# Patient Record
Sex: Female | Born: 1985 | Race: White | Hispanic: No | Marital: Single | Smoking: Former smoker
Health system: Southern US, Community
[De-identification: ages and names within clinical notes are randomized; demographics above are authoritative.]

## PROBLEM LIST (undated history)

## (undated) DIAGNOSIS — G479 Sleep disorder, unspecified: Secondary | ICD-10-CM

## (undated) DIAGNOSIS — F99 Mental disorder, not otherwise specified: Secondary | ICD-10-CM

## (undated) DIAGNOSIS — R569 Unspecified convulsions: Secondary | ICD-10-CM

## (undated) DIAGNOSIS — M1990 Unspecified osteoarthritis, unspecified site: Secondary | ICD-10-CM

## (undated) DIAGNOSIS — F4310 Post-traumatic stress disorder, unspecified: Secondary | ICD-10-CM

## (undated) DIAGNOSIS — F319 Bipolar disorder, unspecified: Secondary | ICD-10-CM

## (undated) DIAGNOSIS — D649 Anemia, unspecified: Secondary | ICD-10-CM

## (undated) DIAGNOSIS — M797 Fibromyalgia: Secondary | ICD-10-CM

## (undated) DIAGNOSIS — I10 Essential (primary) hypertension: Secondary | ICD-10-CM

## (undated) DIAGNOSIS — R87619 Unspecified abnormal cytological findings in specimens from cervix uteri: Secondary | ICD-10-CM

## (undated) DIAGNOSIS — IMO0002 Reserved for concepts with insufficient information to code with codable children: Secondary | ICD-10-CM

## (undated) HISTORY — PX: COLPOSCOPY: SHX161

## (undated) HISTORY — DX: Unspecified abnormal cytological findings in specimens from cervix uteri: R87.619

## (undated) HISTORY — DX: Essential (primary) hypertension: I10

## (undated) HISTORY — DX: Post-traumatic stress disorder, unspecified: F43.10

## (undated) HISTORY — DX: Reserved for concepts with insufficient information to code with codable children: IMO0002

## (undated) HISTORY — DX: Bipolar disorder, unspecified: F31.9

## (undated) HISTORY — DX: Sleep disorder, unspecified: G47.9

## (undated) HISTORY — DX: Mental disorder, not otherwise specified: F99

## (undated) HISTORY — DX: Unspecified convulsions: R56.9

---
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
## 2003-10-06 ENCOUNTER — Inpatient Hospital Stay (HOSPITAL_COMMUNITY): Admission: RE | Admit: 2003-10-06 | Discharge: 2003-10-11 | Payer: Self-pay | Admitting: Obstetrics & Gynecology

## 2005-01-08 HISTORY — PX: DILATION AND CURETTAGE OF UTERUS: SHX78

## 2005-03-06 ENCOUNTER — Ambulatory Visit (HOSPITAL_COMMUNITY): Admission: AD | Admit: 2005-03-06 | Discharge: 2005-03-07 | Payer: Self-pay | Admitting: Obstetrics & Gynecology

## 2005-03-27 ENCOUNTER — Observation Stay (HOSPITAL_COMMUNITY): Admission: AD | Admit: 2005-03-27 | Discharge: 2005-03-28 | Payer: Self-pay | Admitting: Obstetrics and Gynecology

## 2005-08-01 ENCOUNTER — Emergency Department (HOSPITAL_COMMUNITY): Admission: EM | Admit: 2005-08-01 | Discharge: 2005-08-01 | Payer: Self-pay | Admitting: Emergency Medicine

## 2006-03-24 ENCOUNTER — Inpatient Hospital Stay (HOSPITAL_COMMUNITY): Admission: EM | Admit: 2006-03-24 | Discharge: 2006-03-27 | Payer: Self-pay | Admitting: Emergency Medicine

## 2006-03-28 ENCOUNTER — Emergency Department (HOSPITAL_COMMUNITY): Admission: EM | Admit: 2006-03-28 | Discharge: 2006-03-28 | Payer: Self-pay | Admitting: Emergency Medicine

## 2006-08-11 ENCOUNTER — Emergency Department (HOSPITAL_COMMUNITY): Admission: EM | Admit: 2006-08-11 | Discharge: 2006-08-11 | Payer: Self-pay | Admitting: Emergency Medicine

## 2006-08-24 ENCOUNTER — Ambulatory Visit: Payer: Self-pay | Admitting: Obstetrics and Gynecology

## 2006-08-24 ENCOUNTER — Inpatient Hospital Stay (HOSPITAL_COMMUNITY): Admission: AD | Admit: 2006-08-24 | Discharge: 2006-08-24 | Payer: Self-pay | Admitting: Obstetrics & Gynecology

## 2006-11-16 ENCOUNTER — Inpatient Hospital Stay (HOSPITAL_COMMUNITY): Admission: AD | Admit: 2006-11-16 | Discharge: 2006-11-16 | Payer: Self-pay | Admitting: Obstetrics & Gynecology

## 2006-11-16 ENCOUNTER — Ambulatory Visit: Payer: Self-pay | Admitting: *Deleted

## 2006-11-17 ENCOUNTER — Inpatient Hospital Stay (HOSPITAL_COMMUNITY): Admission: AD | Admit: 2006-11-17 | Discharge: 2006-11-20 | Payer: Self-pay | Admitting: Obstetrics and Gynecology

## 2006-11-17 ENCOUNTER — Encounter: Payer: Self-pay | Admitting: Obstetrics and Gynecology

## 2006-11-17 ENCOUNTER — Ambulatory Visit: Payer: Self-pay | Admitting: Obstetrics & Gynecology

## 2010-01-07 ENCOUNTER — Emergency Department (HOSPITAL_COMMUNITY)
Admission: EM | Admit: 2010-01-07 | Discharge: 2010-01-07 | Payer: Self-pay | Source: Home / Self Care | Admitting: Emergency Medicine

## 2010-05-23 NOTE — Discharge Summary (Signed)
[name], [name]            ACCOUNT NO.:  [id number]   MEDICAL RECORD NO.:  [id number]          PATIENT TYPE:  INP   LOCATION:  9372                          FACILITY:  [facility]   PHYSICIAN:  Allie Bossier, MD        DATE OF BIRTH:  05-17-1985   DATE OF ADMISSION:  11/17/2006  DATE OF DISCHARGE:  11/20/2006                               DISCHARGE SUMMARY   ADMISSION DIAGNOSES:  1. Intrauterine pregnancy at 39-weeks and four days.  2. Spontaneous onset of labor.  3. GBS negative.  4. History of intrauterine fetal demise secondary to abruption in her      previous pregnancy.   DISCHARGE DIAGNOSES:  1. Postoperative day #3 back from a vacuum-assisted vaginal delivery      of a viable infant female weighing 7 pounds 5 ounces with Apgars of 8      at one minute and 9 at five minutes.  2. Gestational hypertension, status post magnesium.  3. Obsessive compulsive disorder.  4. GBS status negative.  5. Status post midline episiotomy repair.   PROCEDURES:  1. Midline episiotomy was performed at the time of delivery on      November 17, 2006.  2. Vacuum-assisted vaginal delivery was performed November 17, 2006,      secondary to repetitive variable decelerations.   COMPLICATIONS:  The patient developed elevated blood pressures to  systolics 160, diastolics 100 in the immediate postpartum phase  requiring admission to the adult intensive care unit and magnesium  prophylaxis for possible pre-eclampsia.   CONSULTATIONS:  None.   PERTINENT LABORATORY FINDINGS:  She had CBC performed on admission  showing a white blood cell count of 13.6, hemoglobin 13.3, hematocrit  37.9, platelets 209. Her RPR was nonreactive. She had a CBC and PIH labs  performed on postoperative day #1 with white blood cell count of 14.4,  hemoglobin 12.0, platelets 172. Liver function tests were in the normal  range with AST 37, ALT 12, LDH was 185. Uric acid was 7.0. On  postoperative day #2, she had a CBC showing  white blood cell count of  9.9, hemoglobin 10.4, platelets 158.   BRIEF PERTINENT ADMISSION HISTORY:  This is a 25 year old gravida 3,  para 0 1-1-0 presenting with complaints of contractions. She was  evaluated in the MAU and found to be 5 to 6 cm dilated, 90% effaced  minus 1 station. Her contractions were every two minutes and she was  noted to be having mild variable decelerations. The patient was admitted  with spontaneous onset of labor.   HOSPITAL COURSE:  The patient was admitted and managed expectantly.  The  patient progressed to complete and due to repetitive variables, a Kiwi  vacuum was placed and a vacuum-assisted vaginal delivery was performed  on November 17, 2006, of a viable infant female weighing 7 pounds 5 ounces  with Apgars of 8 and then 8 at one minute and 9 at five minutes. There  was a midline episiotomy performed prior to the vacuum.  It was noted  that the patient had elevated blood pressures to 150 systolic  over 90  diastolic immediately postpartum and she did complain of a headache. The  patient was transferred to the adult intensive care unit where magnesium  prophylaxis for pre-eclampsia was started.  PIH labs were performed and  they were normal as stated above.  The patient continued on magnesium  for 24 hours. She continued to have elevated blood pressures requiring  initiation of antihypertensive therapy with Hydrochlorothiazide 25 mg  daily. She continued to have mild headache, but was otherwise  asymptomatic. She had good diuresis after magnesium was discontinued 24  hours postdelivery.  The patient did admit to having obsessions such as  counting. She was started on Paroxetine 20 mg p.o. daily.  She did need  a couple of doses of Labetalol for blood pressures above 160 systolic.  Her diastolics ranged in the 70 to 90.  On postoperative day #3, the  patient was ambulating, tolerating p.o. with decreasing lochia. She was  breast feeding her female infant  and refrained from deciding on a birth  control method. She states she would like to talk with the physicians at  [facility] in [place], [place] about her birth control  options.  She was in stable condition and blood pressure at the time of  discharge was 140 systolic over 80 diastolic.  The patient was to be  discharged home in stable condition.   DISCHARGE STATUS:  Stable.   DISCHARGE MEDICATIONS:  1. Prenatal Vitamins one tablet daily.  2. Colace 100 mg one tablet twice daily.  3. Motrin 600 mg one tablet by mouth every six hours as needed for      pain.  4. Hydrochlorothiazide 25 mg one tablet daily.  5. Paxil 20 mg one tablet daily.   DISCHARGE INSTRUCTIONS:  1. Discharged home.  2. No sexual activity for six weeks, otherwise activity as tolerated.  3. Regular diet.  4. The patient is to follow up at [facility] in one week for blood      pressure check. The patient is to call to schedule that      appointment, otherwise she is follow up at her six week postpartum      check.      Karlton Lemon, MD  Electronically Signed     ______________________________  Allie Bossier, MD    NS/MEDQ  D:  11/20/2006  T:  11/21/2006  Job:  [id number]   cc:   Tilda Burrow, M.D.  Fax: [phone]

## 2010-05-26 NOTE — Discharge Summary (Signed)
[name], [name]            ACCOUNT NO.:  [id number]   MEDICAL RECORD NO.:  [id number]          PATIENT TYPE:  INP   LOCATION:  A417                          FACILITY:  [facility]   PHYSICIAN:  Tilda Burrow, M.D. DATE OF BIRTH:  [birth year]   DATE OF ADMISSION:  03/24/2006  DATE OF DISCHARGE:  03/19/2008LH                               DISCHARGE SUMMARY   ADMITTING DIAGNOSIS:  1. Right pyelonephritis failing to respond oral Sulfonamide.  2. Pregnancy [redacted] weeks gestation.   DISCHARGE DIAGNOSIS:  1. Right pyelonephritis secondary to Escherichia coli resistant to      sulfa, resistant to gentamicin, sensitive to Rocephin and sensitive      to Macrodantin.  2. Pregnancy [redacted] weeks gestation, gestational sac only seen.   HISTORY OF PRESENT ILLNESS:  This 25 year old female LMP 13 February 2006, positive pregnancy test in the ER 10 days ago was admitted for  right-sided back pain, persistent despite being treated at home with  Septra for a suspected UTI and possible early pyelonephritis.   PAST MEDICAL HISTORY:  Positive for hypertension.   SOCIAL HISTORY:  Negative.   OB HISTORY:  Notable for light brown discharge for the past 3 days with  no prior pregnancy complications.   Urinalysis grossly positive for infection.   HOSPITAL COURSE:  The patient was admitted.  White count 6400,  hemoglobin 12, hematocrit 37, normal differential.  Potassium 3.7, BUN  9, creatinine 0.59 with platelets 178,000.  Quantitative HCG was 10,712.  She was admitted, placed on gentamicin.  Subsequent culture returned  showing greater than 10 to the fifth E. Coli resistant to ampicillin,  resistant to gentamicin, resistant to Septra.  It was sensitive to  ceftriaxone, levofloxacin and nitrofurantoin.  She was switched to  Rocephin and had dramatically improved response.  She will be discharged  home on Macrodantin 100 mg b.i.d. x 7 days followed by suppression for  the duration of the pregnancy at 100 mg  nightly.   Additionally pregnancy was assessed with an ultrasound showing  gestational sac of 5 weeks 5 days, 10.4 mm.  This was consistent with an  estimated gestational age and two early to determine whether this was on  a blighted ovum, an empty sac or an early pregnancy that is normal in  its results.   1. Serum progesterone was ordered and is pending at time of this      dictation.  2. Follow up quantitative HCG as outpatient.  3. Follow-up office visit April 01, 2006 for transvaginal ultrasound      to confirm status of pregnancy.  If progesterone level is low we      will initiate progesterone suppositories.   ADDENDUM:  Prenatal I profile ordered as a part of her admission  revealing rubella immune present and rest of labs are still pending.      Tilda Burrow, M.D.  Electronically Signed     JVF/MEDQ  D:  03/27/2006  T:  03/27/2006  Job:  [id number]   cc:   [facility]

## 2010-05-26 NOTE — H&P (Signed)
[name], [name]            ACCOUNT NO.:  [id number]   MEDICAL RECORD NO.:  [id number]          PATIENT TYPE:  OIB   LOCATION:  A428                          FACILITY:  [facility]   PHYSICIAN:  Lazaro Arms, M.D.   DATE OF BIRTH:  [birth year]   DATE OF ADMISSION:  03/06/2005  DATE OF DISCHARGE:  LH                                HISTORY & PHYSICAL   CHIEF COMPLAINT:  Nausea, vomiting, and dehydration at eight weeks'  gestation.   HISTORY OF PRESENT ILLNESS:  [name] is a 25 year old, gravida 2, para 1,  AB0, living 0 with an EDC of October 17, 2005, based on last menstrual  period and correlating first trimester ultrasound.  She began prenatal care  two weeks ago and was noted to have a twin gestation, however, one sack was  much smaller than the other with no visible fetal heart rate noted.  [name] thinks this may turn out to be a vanishing twin.  She was placed on  progesterone suppositories for progesterone of 10.6.  It did come up to 13,  however, the patient said she did DC the progesterone suppositories on her  own a week ago due to vaginal itching.  Risk of this was discussed and the  patient acknowledges potential risks.   PHYSICAL EXAMINATION:  HEENT:  Within normal limits.  HEART:  Regular rate and rhythm.  LUNGS:  Clear.  ABDOMEN:  Soft and nontender.  Bowel sounds are present.  The patient  complains of inability to keep down even liquids for the past few days.  Indeed, she has had about a 10 pound weight loss in the past week and a  half.  She has Phenergan p.o. at home to which she is not responding  anymore.  She has 2+ ketonuria and just feels awful.   IMPRESSION:  1.  Intrauterine pregnancy at eight weeks one day twin gestation, possible      vanishing twin.  2.  Nausea, vomiting, and dehydration.   PLAN:  1.  We will admit to observation in labor and delivery for IV fluid      rehydration, antiemetics.  2.  We will do some lab work.  3.  Get an  ultrasound over there to assess the status of the gestation.      [name], C.N.M.      Lazaro Arms, M.D.  Electronically Signed    FC/MEDQ  D:  03/06/2005  T:  03/06/2005  Job:  [id number]   cc:   [facility]

## 2010-05-26 NOTE — Group Therapy Note (Signed)
NAMEHANNAN, [name] NO.:  [id number]   MEDICAL RECORD NO.:  [id number]          PATIENT TYPE:  OBV   LOCATION:  LDR3                          FACILITY:  [facility]   PHYSICIAN:  Tilda Burrow, M.D. DATE OF BIRTH:  30-Jun-1985   DATE OF PROCEDURE:  03/28/2005  DATE OF DISCHARGE:                                   PROGRESS NOTE   ADMISSION DIAGNOSES:  1.  Diamniotic twin gestation 11 weeks.  2.  Nausea, vomiting and mild dehydration.   HISTORY OF PRESENT ILLNESS:  This 25 year old female gravida 2, para 1, last  menstrual period January 10, 2005, placing menstrual [facility] October 17, 2005,  with a 6 week ultrasound on February 21, 2005, corresponding within one day.  She is now [redacted] weeks gestation by consensus criteria.  This has been followed  through our office for diamniotic twin gestation with two separate sacks  which were large in size.  There is a question of a possible fluid  collection near the head of baby A noted at the 8 week ultrasound.  She is  referred to do perinatal March 29, 2005, for follow up ultrasound.  Initial  ultrasound was done at [facility] and was available through the  hospital record system.  The patient is called in after 24 hours of  inability to keep anything in her stomach at all.  Review of pregnancy  records shows that she has had progressive weight loss since diagnosis of  pregnancy, having weighed 175 on February 21, 2005, and 166 at last visit on  March 14, 2005.  Upon arrival here, her weight is recorded as 161, 14 pounds  down from initiation of pregnancy.  Laboratory data is obtained but is  considered incorrect due to phlebotomy being performed on the arm with the  vigorous IV infusion actively going on.  This was performed after attempts  on the opposite side have been failed.  She has prior prenatal workup with  documented hemoglobin of 14, hematocrit 40, and labs here suggest severe  anemia consistent with patient's  presentation.  Urinalysis shows specific  gravity greater than 1.030 with ketones greater than 80 mg/dL.  Leukocyte  esterase, nitrites and hemoglobin negative.   PHYSICAL EXAMINATION:  GENERAL APPEARANCE:  A mild and dehydrated (less than  5%) Caucasian female who is resting comfortably after having received  moderate fluid bolus.  ABDOMEN:  Nontender, bowel sounds are present.  She is tolerating dinner at  this time with some solid food consumption without vomiting, after having  received IV Phenergan.   IMPRESSION:  Mild dehydration, hyperemesis, gravid and twin gestation.   PLAN:  Supportive care, Reglan, IV Phenergan, probable brief hospitalization  in observation status.  perinatal      Tilda Burrow, M.D.  Electronically Signed     JVF/MEDQ  D:  03/28/2005  T:  03/28/2005  Job:  [id number]   cc:   [facility]   [facility]  [place], [place]

## 2010-05-26 NOTE — Discharge Summary (Signed)
NAMEOLISA, QUESNEL            ACCOUNT NO.:  [id number]   MEDICAL RECORD NO.:  [id number]          PATIENT TYPE:  INP   LOCATION:  A426                          FACILITY:  [facility]   PHYSICIAN:  Tilda Burrow, M.D. DATE OF BIRTH:  06-04-1985   DATE OF ADMISSION:  10/06/2003  DATE OF DISCHARGE:  10/03/2005LH                                 DISCHARGE SUMMARY   ADMITTING DIAGNOSES:  1.  Abdominal pain since 3:30 a.m.  2.  Fetal demise in uterus secondary to rupture of placenta.   HISTORY OF PRESENT ILLNESS:  This 25 year old female, gravida 1, para 0, at  67 weeks' gestation presents via EMS after awakening with marked abdominal  discomfort with transportation via EMS to [facility] arriving at  5:15 a.m. where external monitoring showed no evidence of fetal heart rate.  I was called and arrived within a few minutes after initial phone contact at  5:45 a.m. with arrival and evaluation where the patient was found to have  ultrasonic confirmation of fetal demise in utero.  Ultrasound showed an  anterior placenta with suspected retroplacental clot and a small amount of  dark vaginal bleed.  There has been a history of trauma.  She was seen with  prenatal care with mildly elevated blood pressure over the last four visits,  but normal lab evaluation October 01, 2003.   PAST MEDICAL HISTORY:  Positive for borderline hypertension.   PAST SURGICAL HISTORY:  Appendectomy.   ALLERGIES:  MORPHINE, which causes hives.   MEDICATIONS:  None.   She had been single, living with the baby's father, working at [employer].  Had been taken out of work secondary to hypertension on October 01, 2003,  at which time blood pressure was 150/100 with urine protein negative.  No  bleeding and nontender abdomen.   HOSPITAL COURSE:  The patient was admitted and had ultrasound by me  revealing the previously mentioned suspected retroplacental clot.  She was  counseled over treatment options and  after some time for initial grieving  and questions, the patient was placed on Cytotec for cervical ripening with  25 mcg vaginal suppository placed at 10:00 a.m.  She had increasing doses of  Cytotec 50 mcg fused at 12:30 p.m. and then at 6:00 p.m. with cervix softer,  minimal contractions, progress without cervical response.  Cervix was soft  enough at 6:00 p.m. that a 50 cc balloon could be placed in the cervix.  Her  laboratory data included a hemoglobin of 11.3, hematocrit 32.2 with  platelets 185k with fibrinogen 324,000 at that time.  She was considered  safe enough to continue induction.  We continued Cytotec through the night  with patient remaining amazingly comfortable through the night.  At 6:00  a.m. laboratory evaluation showed platelets 141,000, fibrinogen 410 mg,  hemoglobin 10, hematocrit 29.1%, white count 10,200.  Cervix at this time  was 2 to 3 cm, 20% and -2.  Amniotomy showed clear fluid, generous volume,  vertex presentation.  She progressed in three hours to 3 to 4 cm, 75%, -2  with IV analgesics tolerating things well.  At  9:30 a.m. molding was  present.  She had paracervical block applied which dramatically improved her  discomfort at midday and she progressed to delivery with 2 pound, 12.2 ounce  stillborn female infant followed by an intact placenta that had a 503 gram  clot attached to almost the entire surface of the placenta.  Tallia viewed  the baby, but did not hold the baby.  The patient's mother held and viewed  the baby.  The patient was supported by pastoral care visits as well as  family members.   Postpartum day one was notable for normal blood pressures, reflexes 2+ and  3+ with 3+ edema with hemoglobin 8.5, hematocrit 24% with platelets 125,000  .  The patient had been on magnesium sulfate during the labor process and  this was discontinued 24 hours postpartum.  The evening of postpartum day  one she had a temperature to 102.8 reducing to 101.1 on  October 09, 2003 with  blood pressure 139/88.  The abdomen was nontender.  She was placed on  ampicillin and gentamicin thinking that group B Strep could be the most  likely source or probably microbial etiology.  Cleocin was added as well.  She progressed, defervesced.  Showed stable hemoglobin of 8.5, hematocrit  24.6 with ampicillin, gentamicin and Cleocin continued times 48 hours with  discharge at that time on October 11, 2003.   Pathology report showed a placenta which showed a centrally located cord  insertion, as normal.  The placenta weighed 180 grams.  There was 365 grams  of clot sent as a specimen.  This is down from the 530 grams it weighed  prior to being placed in formalin.  This was from the backside of the  placenta.   The patient was stable for discharge with apparently satisfactory progress  made in her grieving response.  She will be followed up briefly in one week  with pastoral care follow-up contact to be arranged.   MEDICATIONS:  1.  Motrin 800 mg t.i.d.  2.  HCTZ 25 mg p.o. q.d.  3.  __________ 4 take twice daily until return for postpartum visits      completed.  4.  Levaquin 500 mg q.d. times five days.     John   JVF/MEDQ  D:  11/14/2003  T:  11/15/2003  Job:  [id number]   cc:   Tilda Burrow, M.D.  [address] [place]  [place] [address]  Fax: [phone]

## 2010-05-26 NOTE — Discharge Summary (Signed)
NAMEDEBORRA, [name]            ACCOUNT NO.:  [id number]   MEDICAL RECORD NO.:  [id number]          PATIENT TYPE:  OIB   LOCATION:  A428                          FACILITY:  [facility]   PHYSICIAN:  Tilda Burrow, M.D. DATE OF BIRTH:  June 13, 1985   DATE OF ADMISSION:  03/06/2005  DATE OF DISCHARGE:  02/28/2007LH                                 DISCHARGE SUMMARY   REASON FOR ADMISSION:  Pregnancy at 8 weeks, twin gestation. Nausea,  vomiting, dehydration.   Upon admission, [name] had 3+ ketones in her urine. Labs were drawn and  reviewed to be normal with the exception of ketonuria. The patient responded  well to IV hydration and IV Phenergan. She is now retaining p.o. well and  desires discharge this morning. Ultrasound reveals vital twin gestation.  Also progesterone level is now down to 9.6. This has been discussed with  patient on prior visits in regards that she stopped her progesterone  suppositories due to vaginal discomfort. I reinforced again this morning the  importance of keeping her progesterone level up and using her suppositories  b.i.d. I also explained to her the risks involved of possible miscarriage if  her progesterone level drops any lower than then 9.6 it is now. The patient  verbalized understanding but did not respond to me that she would restart  her suppositories.   PLAN:  We are going to discharge her home today with p.o. Phenergan and p.o.  Zofran. She is going to follow up in the office Friday to see how well she  is doing at home on p.o. antiemetics.      Zerita Boers, Lanier Clam      Tilda Burrow, M.D.  Electronically Signed    DL/MEDQ  D:  09/81/1914  T:  03/07/2005  Job:  [id number]   cc:   [facility]

## 2010-05-26 NOTE — Op Note (Signed)
NAME:  [name], [name] NO.:  [id number]   MEDICAL RECORD NO.:  [id number]          PATIENT TYPE:  INP   LOCATION:  LDR1                          FACILITY:  [facility]   PHYSICIAN:  Tilda Burrow, M.D. DATE OF BIRTH:  [birth year]   DATE OF PROCEDURE:  DATE OF DISCHARGE:                                 OPERATIVE REPORT   Continuing delivery note on [name], 1:45 p.m.  Dictation  interrupted.  Continuing:   The patient delivered the placenta, normal in appearance, with a thick cord  with plenty of Wharton's jelly.  There was immediately after the placenta  was delivered a large, dark, old clot which weighed 503 g.  There was no  further bleeding and no clots were able to be expressed on uterine massage.  The patient then did well with no further bleeding.  A prayer was conducted  with the family.  We then asked the minister to visit with the family.   The patient will be kept on magnesium sulfate for 24 hours.     John   JVF/MEDQ  D:  10/07/2003  T:  10/08/2003  Job:  [id number]

## 2010-05-26 NOTE — Consult Note (Signed)
NAMEMARLETTA, [name]            ACCOUNT NO.:  [id number]   MEDICAL RECORD NO.:  [id number]          PATIENT TYPE:  INP   LOCATION:  A417                          FACILITY:  [facility]   PHYSICIAN:  Tilda Burrow, M.D. DATE OF BIRTH:  [birth year]   DATE OF CONSULTATION:  DATE OF DISCHARGE:                                 CONSULTATION   ADMISSION DIAGNOSES:  1. Right pyelonephritis, failing to respond to oral sulfonamides.  2. Pregnancy at [redacted] weeks gestation.   HISTORY OF PRESENT ILLNESS:  This 25 year old female with LMP February 13, 2006, with positive pregnancy test 10 days ago at [facility] emergency room  where she was seen for right-sided back pain and diagnosed as having a  UTI and early pregnancy.  She is seen again in the emergency room here  after two phone calls earlier this weekend for persistent right-sided  back pain.  She has been afebrile at home but has not had relief of the  pain, has had persistent urinary frequency and discomfort.  She has been  on Septra and has been requiring Darvocet for pain.  She is admitted  after urinalysis in the emergency room showing positive evidence of a  persistent urinary tract infection, and physical exam shows tenderness  to percussion over the right kidney area. She is admitted for IV  antibiotic therapy.   PAST MEDICAL HISTORY:  Positive for hypertension.   SOCIAL HISTORY:  Negative.   OB HISTORY:  LMP February 13, 2006.  Light brown discharge for past 3  days.  She is having no frank bleeding.  She has had no prior pregnancy  complications.   PHYSICAL EXAMINATION:  VITAL SIGNS:  She presented at 8;30 a.m.  afebrile, temperature 98.3, pulse 92, respirations 16.  Pain rated 8/10.  O2 saturation 98% on room air.  GENERAL:  Alert, mildly uncomfortable, oriented x3, and cooperative with  exam.  CHEST:  Clear.  ABDOMEN:  Nontender to palpation.  PELVIC:  Exam deferred at this time.  BACK:  CVA tenderness, very specifically noted on the  right side.   Urinalysis is positive for nitrites, leukocyte esterase, 30 mg/dl  protein, and trace hemoglobin.  Microscopic shows 21-50 white cells, 11-  20 red cells, and many bacteria.   IMPRESSION:  Urinary tract infection, early pyelonephritis not  responding to oral sulfonamides.   PLAN:  1. Admit.  2. Due to alleged KEFLEX allergy (rash and redness), aminoglycoside      and proceed with initial prenatal labs.  3. Anticipate brief hospitalization.      Tilda Burrow, M.D.  Electronically Signed     JVF/MEDQ  D:  03/24/2006  T:  03/24/2006  Job:  [id number]

## 2010-05-26 NOTE — Op Note (Signed)
NAMEDAINELLE, HUN            ACCOUNT NO.:  [id number]   MEDICAL RECORD NO.:  [id number]          PATIENT TYPE:  INP   LOCATION:  LDR1                          FACILITY:  [facility]   PHYSICIAN:  Tilda Burrow, M.D. DATE OF BIRTH:  03/31/1985   DATE OF PROCEDURE:  10/07/2003  DATE OF DISCHARGE:                                 OPERATIVE REPORT   LABOR SUMMARY AND DELIVERY NOTE:  Waylon's labor was a prolonged process.  On October 06, 2003, she had Pitocin which was infused up to 16 to 18  milliunits per minute without response to oxytocin. Uterine tone remained  high, but the cervix was anterior, firm and closed. Efforts to insert a  Foley bulb in the cervix were unsuccessful x3. When then converted to  Cytotec for cervical ripening. Initially 25 mcg tablets, then 50 mcg tablets  which were administered through the afternoon of October 06, 2003. A Foley  bulb balloon could be reinserted at 6:35 p.m. and was left in overnight.  Cervix dilated in response to this. Meanwhile, hemoglobin changes were  gradual with hemoglobin 11.6, hematocrit 33 at noon; 11.3 and 32.2 at 6 p.m.  On the morning of September 29, the hemoglobin was diminished to 10.1,  hematocrit 29.1. Platelets varied from 189 upon admission to 159 six hours  later to 185 at 6 p.m. last night and decreased to 141,000 at 6 a.m.   The patient received a paracervical block as she reached active phase labor  and over the last hour had good relief. She delivered at 1:45 p.m. over an  intact peritoneum, a 2-pound 12.2 ounce female infant stillborn followed by a   Dictation ended at this point.     John   JVF/MEDQ  D:  10/07/2003  T:  10/07/2003  Job:  [id number]

## 2010-10-17 LAB — COMPREHENSIVE METABOLIC PANEL
AST: 37
Albumin: 2.8 — ABNORMAL LOW
Alkaline Phosphatase: 223 — ABNORMAL HIGH
BUN: 5 — ABNORMAL LOW
Chloride: 108
GFR calc Af Amer: >60
Potassium: 3.6
Sodium: 137
Total Bilirubin: 0.7
Total Protein: 6.4

## 2010-10-17 LAB — CBC
HCT: 29.8 — ABNORMAL LOW
Hemoglobin: 10.4 — ABNORMAL LOW
MCHC: 35.2
MCV: 83.2
MCV: 84.8
Platelets: 158
Platelets: 172
Platelets: 209
RBC: 4.55
RDW: 15.9 — ABNORMAL HIGH
RDW: 16 — ABNORMAL HIGH
RDW: 16.5 — ABNORMAL HIGH
WBC: 14.4 — ABNORMAL HIGH

## 2010-10-17 LAB — URIC ACID: Uric Acid, Serum: 7

## 2010-10-17 LAB — RPR: RPR Ser Ql: NONREACTIVE

## 2010-10-20 LAB — URINALYSIS, ROUTINE W REFLEX MICROSCOPIC
Ketones, ur: NEGATIVE
Leukocytes, UA: NEGATIVE
Nitrite: NEGATIVE
Protein, ur: NEGATIVE
pH: 6

## 2010-10-20 LAB — RAPID URINE DRUG SCREEN, HOSP PERFORMED
Amphetamines: NOT DETECTED
Barbiturates: NOT DETECTED
Benzodiazepines: NOT DETECTED
Opiates: NOT DETECTED
Tetrahydrocannabinol: NOT DETECTED

## 2010-10-20 LAB — WET PREP, GENITAL
Trich, Wet Prep: NONE SEEN
Yeast Wet Prep HPF POC: NONE SEEN

## 2010-10-20 LAB — URINE MICROSCOPIC-ADD ON

## 2010-12-14 ENCOUNTER — Other Ambulatory Visit: Payer: Self-pay | Admitting: Family Medicine

## 2010-12-14 ENCOUNTER — Other Ambulatory Visit (HOSPITAL_COMMUNITY)
Admission: RE | Admit: 2010-12-14 | Discharge: 2010-12-14 | Disposition: A | Discharge status: Home / Self Care | Payer: Medicaid Other | Source: Ambulatory Visit | Attending: Obstetrics and Gynecology | Admitting: Obstetrics and Gynecology

## 2010-12-14 DIAGNOSIS — Z01419 Encounter for gynecological examination (general) (routine) without abnormal findings: Secondary | ICD-10-CM | POA: Insufficient documentation

## 2010-12-14 DIAGNOSIS — Z113 Encounter for screening for infections with a predominantly sexual mode of transmission: Secondary | ICD-10-CM | POA: Insufficient documentation

## 2010-12-14 LAB — OB RESULTS CONSOLE HEPATITIS B SURFACE ANTIGEN: Hepatitis B Surface Ag: NEGATIVE

## 2010-12-14 LAB — OB RESULTS CONSOLE HIV ANTIBODY (ROUTINE TESTING): HIV: NONREACTIVE

## 2010-12-14 LAB — OB RESULTS CONSOLE ABO/RH: RH Type: POSITIVE

## 2010-12-14 LAB — OB RESULTS CONSOLE ANTIBODY SCREEN: Antibody Screen: NEGATIVE

## 2010-12-14 LAB — OB RESULTS CONSOLE RUBELLA ANTIBODY, IGM: Rubella: IMMUNE

## 2011-07-23 ENCOUNTER — Other Ambulatory Visit: Payer: Self-pay | Admitting: Obstetrics and Gynecology

## 2011-07-23 ENCOUNTER — Telehealth (HOSPITAL_COMMUNITY): Payer: Self-pay | Admitting: *Deleted

## 2011-07-23 ENCOUNTER — Encounter (HOSPITAL_COMMUNITY): Payer: Self-pay | Admitting: *Deleted

## 2011-07-23 NOTE — H&P (Signed)
[name] is a 26 y.o. 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 Nonstress tests have been Maternal Medical History:  Prenatal complications: Hypertension.     OB History    Grav Para Term Preterm Abortions TAB SAB Ect Mult Living   4 2 1 1 1  1   1      Past Medical History  Diagnosis Date  . Seizures     Epilepsy  . Hypertension     no meds  . Mental disorder     Bi Polar  . Abnormal Pap smear    Past Surgical History  Procedure Date  . Dilation and curettage of uterus 2007  . Appendectomy 1995  . Colposcopy    Family History: family history includes Autism in her cousin; Cancer in her maternal grandmother; and Stroke in her maternal grandfather, maternal grandmother, paternal grandfather, and paternal grandmother. Social History:  does not have a smoking history on file. She does not have any smokeless tobacco history on file. Her alcohol and drug histories not on file.   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   Genitourinary: Negative.        Short cervix during March and April without progression cervical length 2.2-2.9 cm in length. Betamethasone administered and a  16th and 17th 2013      Last menstrual period 10/23/2010. Maternal Exam:  Abdomen: Patient reports no abdominal tenderness. Fundal height is 35 cm.   Estimated fetal weight is 7 pounds.   Fetal presentation: vertex  Introitus: Normal vulva. Normal vagina.  Ferning test: not done.   Pelvis: adequate for delivery.   Cervix: Cervix evaluated by digital exam.     Physical Exam  Constitutional: She is oriented to person, place, and time. She appears well-developed and well-nourished.       Weight 195 pounds blood pressure 140/90  HENT:  Head: Normocephalic and atraumatic.  Eyes: EOM are normal. Pupils are equal, round, and reactive to light.  Neck: Normal range of motion. Neck supple.  Cardiovascular: Normal rate and regular rhythm.   Respiratory: Effort normal and breath sounds normal. She has no wheezes.  GI: Soft.       Gravid uterus consistent with dates 35 cm fundal height  Neurological: She is oriented to person, place, and time.  Psychiatric: She has a normal mood and affect. Her behavior is normal.       Diffusely anxious    Prenatal labs: ABO, Rh: A/Positive/-- (12/06 0000) Antibody: Negative (12/06 0000) Rubella: Immune (12/06 0000) RPR: Nonreactive (12/06 0000)  HBsAg: Negative (12/06 0000)  HIV: Non-reactive (12/06 0000)  GBS: Positive (06/27 0000)   Assessment/Plan: Pregnancy 39-1/[redacted] weeks gestation in labor. History epilepsy, treated with Lamictal, bipolar disorder with anxiety treated with Lexapro, history of abruption at 17  weeks with prior pregnancy, scheduled for induction of labor, gestational hypertension without evidence of preeclampsia over the last 2 weeks, reactive nonstress test biweekly  Plan: Induction of labor on Friday, 07/27/2011   Indie Boehne V 07/23/2011, 3:44 PM

## 2011-07-23 NOTE — Telephone Encounter (Signed)
Preadmission screen  

## 2011-07-27 ENCOUNTER — Encounter (HOSPITAL_COMMUNITY): Payer: Self-pay

## 2011-07-27 ENCOUNTER — Inpatient Hospital Stay (HOSPITAL_COMMUNITY): Payer: Medicaid Other | Admitting: Anesthesiology

## 2011-07-27 ENCOUNTER — Inpatient Hospital Stay (HOSPITAL_COMMUNITY)
Admission: RE | Admit: 2011-07-27 | Discharge: 2011-07-29 | DRG: 775 | Disposition: A | Discharge status: Home / Self Care | Payer: Medicaid Other | Source: Ambulatory Visit | Attending: Obstetrics and Gynecology | Admitting: Obstetrics and Gynecology

## 2011-07-27 ENCOUNTER — Encounter (HOSPITAL_COMMUNITY): Payer: Self-pay | Admitting: Anesthesiology

## 2011-07-27 DIAGNOSIS — O139 Gestational [pregnancy-induced] hypertension without significant proteinuria, unspecified trimester: Principal | ICD-10-CM | POA: Diagnosis present

## 2011-07-27 DIAGNOSIS — G40909 Epilepsy, unspecified, not intractable, without status epilepticus: Secondary | ICD-10-CM | POA: Diagnosis present

## 2011-07-27 LAB — CBC
HCT: 29.5 % — ABNORMAL LOW (ref 36.0–46.0)
Hemoglobin: 10 g/dL — ABNORMAL LOW (ref 12.0–15.0)
MCH: 26.8 pg (ref 26.0–34.0)
MCHC: 32.9 g/dL (ref 30.0–36.0)
MCV: 82.7 fL (ref 78.0–100.0)
MCV: 81.5 fL (ref 78.0–100.0)
Platelets: 153 10*3/uL (ref 150–400)
Platelets: 132 10*3/uL — ABNORMAL LOW (ref 150–400)
RBC: 4.05 MIL/uL (ref 3.87–5.11)
RBC: 3.75 MIL/uL — ABNORMAL LOW (ref 3.87–5.11)
RDW: 15.8 % — ABNORMAL HIGH (ref 11.5–15.5)
WBC: 6.3 10*3/uL (ref 4.0–10.5)
WBC: 6.9 10*3/uL (ref 4.0–10.5)

## 2011-07-27 LAB — COMPREHENSIVE METABOLIC PANEL
AST: 16 U/L (ref 0–37)
Albumin: 2.7 g/dL — ABNORMAL LOW (ref 3.5–5.2)
Alkaline Phosphatase: 136 U/L — ABNORMAL HIGH (ref 39–117)
Chloride: 104 mEq/L (ref 96–112)
Creatinine, Ser: 0.49 mg/dL — ABNORMAL LOW (ref 0.50–1.10)
Potassium: 4.3 mEq/L (ref 3.5–5.1)
Total Bilirubin: 0.2 mg/dL — ABNORMAL LOW (ref 0.3–1.2)
Total Protein: 5.9 g/dL — ABNORMAL LOW (ref 6.0–8.3)

## 2011-07-27 LAB — RPR: RPR Ser Ql: NONREACTIVE

## 2011-07-27 LAB — ABO/RH: ABO/RH(D): A POS

## 2011-07-27 LAB — TYPE AND SCREEN

## 2011-07-27 LAB — PROTEIN / CREATININE RATIO, URINE: Total Protein, Urine: 5.3 mg/dL

## 2011-07-27 MED ORDER — FLEET ENEMA 7-19 GM/118ML RE ENEM: 1.0000 | ENEMA | RECTAL | Status: DC | PRN

## 2011-07-27 MED ORDER — SODIUM BICARBONATE 8.4 % IV SOLN
INTRAVENOUS | Status: DC | PRN
  Administered 2011-07-27: 4 mL via EPIDURAL

## 2011-07-27 MED ORDER — LIDOCAINE HCL (PF) 1 % IJ SOLN
INTRAMUSCULAR | Status: DC | PRN
  Administered 2011-07-27 (×3): 4 mL

## 2011-07-27 MED ORDER — PHENYLEPHRINE 40 MCG/ML (10ML) SYRINGE FOR IV PUSH (FOR BLOOD PRESSURE SUPPORT): 80.0000 ug | PREFILLED_SYRINGE | INTRAVENOUS | Status: DC | PRN

## 2011-07-27 MED ORDER — MISOPROSTOL 25 MCG QUARTER TABLET
25.0000 ug | ORAL_TABLET | ORAL | Status: DC | PRN
  Administered 2011-07-27: 25 ug via VAGINAL
  Filled 2011-07-27: qty 1
  Filled 2011-07-27 (×2): qty 0.25

## 2011-07-27 MED ORDER — PENICILLIN G POTASSIUM 5000000 UNITS IJ SOLR
2.5000 10*6.[IU] | INTRAVENOUS | Status: DC
  Administered 2011-07-27 (×3): 2.5 10*6.[IU] via INTRAVENOUS
  Filled 2011-07-27 (×5): qty 2.5

## 2011-07-27 MED ORDER — LIDOCAINE HCL (PF) 1 % IJ SOLN
30.0000 mL | INTRAMUSCULAR | Status: DC | PRN
  Filled 2011-07-27: qty 30

## 2011-07-27 MED ORDER — OXYTOCIN 40 UNITS IN LACTATED RINGERS INFUSION - SIMPLE MED
62.5000 mL/h | Freq: Once | INTRAVENOUS | Status: DC
  Filled 2011-07-27 (×2): qty 1000

## 2011-07-27 MED ORDER — NALBUPHINE SYRINGE 5 MG/0.5 ML: 5.0000 mg | INJECTION | INTRAMUSCULAR | Status: DC | PRN

## 2011-07-27 MED ORDER — PENICILLIN G POTASSIUM 5000000 UNITS IJ SOLR
5.0000 10*6.[IU] | Freq: Once | INTRAVENOUS | Status: AC
  Administered 2011-07-27: 5 10*6.[IU] via INTRAVENOUS
  Filled 2011-07-27: qty 5

## 2011-07-27 MED ORDER — DIPHENHYDRAMINE HCL 50 MG/ML IJ SOLN: 12.5000 mg | INTRAMUSCULAR | Status: DC | PRN

## 2011-07-27 MED ORDER — ACETAMINOPHEN 325 MG PO TABS: 650.0000 mg | ORAL_TABLET | ORAL | Status: DC | PRN

## 2011-07-27 MED ORDER — PHENYLEPHRINE 40 MCG/ML (10ML) SYRINGE FOR IV PUSH (FOR BLOOD PRESSURE SUPPORT)
80.0000 ug | PREFILLED_SYRINGE | INTRAVENOUS | Status: DC | PRN
  Filled 2011-07-27: qty 5

## 2011-07-27 MED ORDER — LACTATED RINGERS IV SOLN
INTRAVENOUS | Status: DC
  Administered 2011-07-27: 20:00:00 via INTRAVENOUS
  Administered 2011-07-27: 125 mL/h via INTRAVENOUS

## 2011-07-27 MED ORDER — FENTANYL 2.5 MCG/ML BUPIVACAINE 1/10 % EPIDURAL INFUSION (WH - ANES)
14.0000 mL/h | INTRAMUSCULAR | Status: DC
  Administered 2011-07-27 (×3): 14 mL/h via EPIDURAL
  Filled 2011-07-27 (×3): qty 60

## 2011-07-27 MED ORDER — OXYTOCIN 40 UNITS IN LACTATED RINGERS INFUSION - SIMPLE MED: 1.0000 m[IU]/min | INTRAVENOUS | Status: DC

## 2011-07-27 MED ORDER — SODIUM BICARBONATE 8.4 % IV SOLN
INTRAVENOUS | Status: DC | PRN
  Administered 2011-07-27: 5 mL via EPIDURAL

## 2011-07-27 MED ORDER — OXYTOCIN BOLUS FROM INFUSION
250.0000 mL | Freq: Once | INTRAVENOUS | Status: DC
  Filled 2011-07-27: qty 500

## 2011-07-27 MED ORDER — TERBUTALINE SULFATE 1 MG/ML IJ SOLN: 0.2500 mg | Freq: Once | INTRAMUSCULAR | Status: AC | PRN

## 2011-07-27 MED ORDER — LACTATED RINGERS IV SOLN
500.0000 mL | INTRAVENOUS | Status: DC | PRN
  Administered 2011-07-27: 500 mL via INTRAVENOUS

## 2011-07-27 MED ORDER — OXYCODONE-ACETAMINOPHEN 5-325 MG PO TABS: 1.0000 | ORAL_TABLET | ORAL | Status: DC | PRN

## 2011-07-27 MED ORDER — OXYTOCIN 40 UNITS IN LACTATED RINGERS INFUSION - SIMPLE MED
1.0000 m[IU]/min | INTRAVENOUS | Status: DC
  Administered 2011-07-27 (×2): 2 m[IU]/min via INTRAVENOUS

## 2011-07-27 MED ORDER — IBUPROFEN 600 MG PO TABS: 600.0000 mg | ORAL_TABLET | Freq: Four times a day (QID) | ORAL | Status: DC | PRN

## 2011-07-27 MED ORDER — EPHEDRINE 5 MG/ML INJ: 10.0000 mg | INTRAVENOUS | Status: DC | PRN

## 2011-07-27 MED ORDER — MISOPROSTOL 200 MCG PO TABS
800.0000 ug | ORAL_TABLET | Freq: Once | ORAL | Status: DC
  Filled 2011-07-27: qty 4

## 2011-07-27 MED ORDER — ONDANSETRON HCL 4 MG/2ML IJ SOLN: 4.0000 mg | Freq: Four times a day (QID) | INTRAMUSCULAR | Status: DC | PRN

## 2011-07-27 MED ORDER — LACTATED RINGERS IV SOLN
500.0000 mL | Freq: Once | INTRAVENOUS | Status: AC
  Administered 2011-07-27: 500 mL via INTRAVENOUS

## 2011-07-27 MED ORDER — EPHEDRINE 5 MG/ML INJ
10.0000 mg | INTRAVENOUS | Status: DC | PRN
  Filled 2011-07-27: qty 4

## 2011-07-27 MED ORDER — CITRIC ACID-SODIUM CITRATE 334-500 MG/5ML PO SOLN
30.0000 mL | ORAL | Status: DC | PRN
  Administered 2011-07-27: 30 mL via ORAL
  Filled 2011-07-27: qty 15

## 2011-07-27 NOTE — Progress Notes (Signed)
[name] is a 26 y.o. [phone] (prior history of IUFD at 35 weeks) at [redacted]w[redacted]d by LMP admitted for induction of labor due to gestational hypertension.  Subjective: [name] is feeling well this morning.  She reports good fetal movement and denies LOF, contractions, and vaginal bleeding.  She denies HA, blurry vision and RUQ/epigastric pain.  Objective: BP 147/101  Pulse 95  Temp 98.1 F (36.7 C) (Oral)  Resp 20  Ht 5\' 5"  (1.651 m)  Wt 88.451 kg (195 lb)  BMI 32.45 kg/m2  LMP 10/23/2010     Temp:  [98.1 F (36.7 C)] 98.1 F (36.7 C) (07/19 0748) Pulse Rate:  [95-99] 95  (07/19 0839) Resp:  [20] 20  (07/19 0839) BP: (147-150)/(101) 147/101 mmHg (07/19 0839) Weight:  [88.451 kg (195 lb)] 88.451 kg (195 lb) (07/19 0800)  FHT:  FHR: 125 bpm, variability: moderate,  accelerations:  Present,  decelerations:  Absent UC:   none SVE:   Dilation: 2 Effacement (%): 50 Station: Ballotable Exam by:: [employer]: Lab Results  Component Value Date   WBC 6.3 07/27/2011   HGB 10.6* 07/27/2011   HCT 33.6* 07/27/2011   MCV 83.0 07/27/2011   PLT 153 07/27/2011    Assessment / Plan: Induction of labor due to gestational hypertension.  Given Bishop score of 5, will start with Foley balloon and Cytotec, and then progress to Pitocin as needed.  Blood pressure not in severe range. Labor: Not started Preeclampsia:  Gestational HTN, no evidence of pre-e. Urine protein:creatinine pending. CMP and CBC as well. Fetal Wellbeing:  Category I Pain Control:  Labor support without medications Anticipated MOD:  NSVD  Dr. Adrian Blackwater present for initial evaluation as well.  Junious Silk S 07/27/2011, 9:13 AM  ADDENDUM: Foley balloon and Cytotec #1 ( ) placed at 0930.  Pt tolerated procedure well.  Will defer cervical re-check until Foley falls out, pt feeling increased pressure, or 4 hours past after Foley placed (at which time we will repeat Cytotec if needed).  [name], MD

## 2011-07-27 NOTE — Progress Notes (Signed)
Patient ID: [name], female   DOB: April 29, 1985, 26 y.o.   MRN: [id number]  [name] is a 26 y.o. [phone] (prior history of IUFD at 35 weeks) at [redacted]w[redacted]d by LMP admitted for induction of labor due to gestational hypertension.  Subjective: [name] reports good fetal movement and denies LOF, contractions, and vaginal bleeding.  She denies HA, blurry vision and RUQ/epigastric pain.  Objective: BP 140/96  Pulse 94  Temp 98.1 F (36.7 C) (Oral)  Resp 20  Ht 5\' 5"  (1.651 m)  Wt 88.451 kg (195 lb)  BMI 32.45 kg/m2  LMP 10/23/2010     Temp:  [98.1 F (36.7 C)] 98.1 F (36.7 C) (07/19 1105) Pulse Rate:  [73-99] 94  (07/19 1328) Resp:  [20] 20  (07/19 1202) BP: (112-152)/(75-101) 140/96 mmHg (07/19 1328) Weight:  [88.451 kg (195 lb)] 88.451 kg (195 lb) (07/19 0800)  FHT:  FHR: 125 bpm, variability: moderate,  accelerations:  Present,  decelerations:  Absent UC:   none SVE:   Dilation: 4 Effacement (%): 50 Station: -3  Exam by:: Ihor Austin @ 1330  Labs: Lab Results  Component Value Date   WBC 6.3 07/27/2011   HGB 10.6* 07/27/2011   HCT 33.6* 07/27/2011   MCV 83.0 07/27/2011   PLT 153 07/27/2011   Urine protein:creatinine = 0.09 CMP wnl  Assessment / Plan: Induction of labor due to gestational hypertension. Achieved cervical dilation with Foley balloon and Cytotec - now will start Pitocin.  Patient in agreement.  Labor: Not started Preeclampsia:  Gestational HTN, no evidence of pre-eclampsia. Fetal Wellbeing:  Category I Pain Control:  Labor support without medications; pt desires epidural when in active labor Anticipated MOD:  NSVD    [name] 07/27/2011, 1:46 PM

## 2011-07-27 NOTE — Progress Notes (Signed)
Patient seen and examined.  Agree with above note.  Levie Heritage, DO 07/27/2011 9:23 PM

## 2011-07-27 NOTE — Progress Notes (Addendum)
Patient ID: [name], female   DOB: 01-26-1985, 26 y.o.   MRN: [id number]  [name] is a 26 y.o. [id number] at [redacted]w[redacted]d by LMP admitted for induction of labor due to Hypertension.  Subjective: [name] is much more comfortable now with her epidural.  Objective: BP 140/88  Pulse 90  Temp 98.8 F (37.1 C) (Oral)  Resp 18  Ht 5\' 5"  (1.651 m)  Wt 88.451 kg (195 lb)  BMI 32.45 kg/m2  SpO2 99%  LMP 10/23/2010 I/O last 3 completed shifts: In: -  Out: 600 [Urine:600]   Temp:  [98 F (36.7 C)-98.8 F (37.1 C)] 98.8 F (37.1 C) (07/19 1931) Pulse Rate:  [73-103] 90  (07/19 1931) Resp:  [18-20] 18  (07/19 1931) BP: (112-166)/(75-108) 140/88 mmHg (07/19 1931) SpO2:  [99 %-100 %] 99 % (07/19 1652) Weight:  [88.451 kg (195 lb)] 88.451 kg (195 lb) (07/19 0800)   FHT:  FHR: 130 bpm, variability: moderate,  accelerations:  Present,  decelerations:  Absent UC:   regular, every 2 minutes SVE:   Dilation: 6.5 Effacement (%): 80 Station: -2 Exam by:: sowder, RNC  Labs: Lab Results  Component Value Date   WBC 6.9 07/27/2011   HGB 10.0* 07/27/2011   HCT 31.0* 07/27/2011   MCV 82.7 07/27/2011   PLT 132* 07/27/2011    Assessment / Plan: Induction of labor due to gestational hypertension,  progressing well on pitocin.  BPs not in severe range.  She had a period of borderline tachysystole with ctx q2 minutes that eventually led to minimal variability in FHR + a late deceleration, so we decreased the Pitocin from 4 mU/min to 46mU/min and soon had return to a category I strip.  Labor: Progressing normally Preeclampsia: Just gestational HTN, normal urine P:C ration. no signs or symptoms of toxicity Fetal Wellbeing:  Category I now Pain Control:  Epidural Anticipated MOD:  NSVD  [name] 07/27/2011, 7:34 PM  ADDENDUM: AROM by Dr. Emelda Fear at [phone].

## 2011-07-27 NOTE — Progress Notes (Signed)
Patient seen and examined.  Agree with above note.  Hollan Philipp J Hikaru Delorenzo, DO 07/27/2011 6:32 PM   

## 2011-07-27 NOTE — Progress Notes (Signed)
Patient seen and examined.  Agree with above note.  Jacob J Stinson, DO 07/27/2011 7:47 PM   

## 2011-07-27 NOTE — Anesthesia Procedure Notes (Signed)
Epidural Patient location during procedure: OB Start time: 07/27/2011 4:06 PM Reason for block: procedure for pain  Staffing Performed by: anesthesiologist   Preanesthetic Checklist Completed: patient identified, site marked, surgical consent, pre-op evaluation, timeout performed, IV checked, risks and benefits discussed and monitors and equipment checked  Epidural Patient position: sitting Prep: site prepped and draped and DuraPrep Patient monitoring: continuous pulse ox and blood pressure Approach: midline Injection technique: LOR air  Needle:  Needle type: Tuohy  Needle gauge: 17 G Needle length: 9 cm Needle insertion depth: 5 cm cm Catheter type: closed end flexible Catheter size: 19 Gauge Catheter at skin depth: 10 cm Test dose: negative  Assessment Events: blood not aspirated, injection not painful, no injection resistance, negative IV test and no paresthesia  Additional Notes Discussed risk of headache, infection, bleeding, nerve injury and failed or incomplete block.  Patient voices understanding and wishes to proceed.

## 2011-07-27 NOTE — Progress Notes (Signed)
Patient seen and examined.  Agree with above note.  Arlene Brickel J Hattye Siegfried, DO 07/27/2011 3:00 PM   

## 2011-07-27 NOTE — Anesthesia Preprocedure Evaluation (Addendum)
Anesthesia Evaluation  Patient identified by MRN, date of birth, ID band Patient awake    Reviewed: Allergy & Precautions, H&P , NPO status , Patient's Chart, lab work & pertinent test results, reviewed documented beta blocker date and time   History of Anesthesia Complications Negative for: history of anesthetic complications  Airway Mallampati: III TM Distance: >3 FB Neck ROM: full  Mouth opening: Limited Mouth Opening  Dental  (+) Teeth Intact   Pulmonary former smoker,  breath sounds clear to auscultation        Cardiovascular hypertension (PIH), Rhythm:regular Rate:Normal     Neuro/Psych Seizures - (seizure disorder, last seizure 7 months ago),  PSYCHIATRIC DISORDERS (bipolar d/o) negative psych ROS   GI/Hepatic negative GI ROS, Neg liver ROS,   Endo/Other  negative endocrine ROS  Renal/GU negative Renal ROS  negative genitourinary   Musculoskeletal   Abdominal   Peds  Hematology negative hematology ROS (+) Blood dyscrasia (hgb 10), anemia ,   Anesthesia Other Findings   Reproductive/Obstetrics (+) Pregnancy                           Anesthesia Physical Anesthesia Plan  ASA: III  Anesthesia Plan: Epidural   Post-op Pain Management:    Induction:   Airway Management Planned:   Additional Equipment:   Intra-op Plan:   Post-operative Plan:   Informed Consent: I have reviewed the patients History and Physical, chart, labs and discussed the procedure including the risks, benefits and alternatives for the proposed anesthesia with the patient or authorized representative who has indicated his/her understanding and acceptance.     Plan Discussed with:   Anesthesia Plan Comments:         Anesthesia Quick Evaluation

## 2011-07-27 NOTE — Progress Notes (Signed)
Patient seen and examined.  Agree with above note.  Levie Heritage, DO 07/27/2011 4:27 PM

## 2011-07-27 NOTE — Progress Notes (Signed)
Patient seen and examined.  Agree with above note.  Levie Heritage, DO 07/27/2011 10:09 AM

## 2011-07-27 NOTE — Progress Notes (Signed)
Patient ID: [name], female   DOB: Jun 13, 1985, 26 y.o.   MRN: [id number] Delivery Note At 10:22 PM a viable and healthy female was delivered via Vaginal, Spontaneous Delivery (Presentation: ;left occiput anterior  ).  APGAR:9/9 , ; weight .   Placenta status:intact, schultz presentation , . 3VC Cord:  with the following complications: none.  Cord pH: not checked  Anesthesia:  Epidural  Episiotomy: none  Lacerations: 1st degree not requiring repair Suture Repair: none Est. Blood Loss (mL):   Mom to postpartum.  Baby to nursery-stable.  Honest Safranek V 07/27/2011, 10:43 PM  Birth control plans:  Desires BTL , needs to sign papers either in hospital or promptly in office to begin 30 day delay for medicaid coverage

## 2011-07-27 NOTE — Progress Notes (Signed)
Patient ID: [name], female   DOB: 01-Jun-1985, 26 y.o.   MRN: [id number]  [name] is a 26 y.o. [id number] at [redacted]w[redacted]d by LMP admitted for induction of labor due to Hypertension.  Subjective: [name] is still feeling quite a bit of pain with her contractions even after her epidural - they have called anesthesia to see if any adjustments need to be made.  Objective: BP 150/79  Pulse 90  Temp 98 F (36.7 C) (Oral)  Resp 20  Ht 5\' 5"  (1.651 m)  Wt 88.451 kg (195 lb)  BMI 32.45 kg/m2  SpO2 99%  LMP 10/23/2010     Temp:  [98 F (36.7 C)-98.3 F (36.8 C)] 98 F (36.7 C) (07/19 1618) Pulse Rate:  [73-103] 90  (07/19 1707) Resp:  [20] 20  (07/19 1700) BP: (112-166)/(75-108) 150/79 mmHg (07/19 1707) SpO2:  [99 %-100 %] 99 % (07/19 1652) Weight:  [88.451 kg (195 lb)] 88.451 kg (195 lb) (07/19 0800)   FHT:  FHR: 130 bpm, variability: moderate,  accelerations:  Present,  decelerations:  Absent UC:   regular, every 2 minutes SVE:   Dilation: 6 Effacement (%): 50 Station: -3 Exam by:: [name]  Labs: Lab Results  Component Value Date   WBC 6.9 07/27/2011   HGB 10.0* 07/27/2011   HCT 31.0* 07/27/2011   MCV 82.7 07/27/2011   PLT 132* 07/27/2011    Assessment / Plan: Induction of labor due to gestational hypertension,  progressing well on pitocin.  BPs not in severe range.  Plan is to increase Pitocin gradually as needed to produce continued cervical change.  Labor: Progressing normally Preeclampsia: Just gestational HTN, normal urine P:C ration. no signs or symptoms of toxicity Fetal Wellbeing:  Category I Pain Control:  Epidural Anticipated MOD:  NSVD  [name] 07/27/2011, 5:09 PM

## 2011-07-27 NOTE — Progress Notes (Signed)
[name] is a 26 y.o. [id number] at [redacted]w[redacted]d by LMP admitted for induction of labor due to Hypertension.  Subjective: Ms. [name] is feeling her contractions and is interested in getting an epidural.  Objective: BP 145/85  Pulse 75  Temp 98.3 F (36.8 C) (Oral)  Resp 20  Ht 5\' 5"  (1.651 m)  Wt 88.451 kg (195 lb)  BMI 32.45 kg/m2  LMP 10/23/2010     FHT:  FHR: 120 bpm, variability: moderate,  accelerations:  Present,  decelerations:  Absent UC:   regular, every 2-3 minutes SVE:   Dilation: 4 Effacement (%): 50 Station: -3 Exam by:: Christinna Sprung  Labs: Lab Results  Component Value Date   WBC 6.3 07/27/2011   HGB 10.6* 07/27/2011   HCT 33.6* 07/27/2011   MCV 83.0 07/27/2011   PLT 153 07/27/2011    Assessment / Plan: Induction of labor due to gestational hypertension,  progressing well on pitocin  Plan is to check cervix in next 60-90 minutes to assess response to Pitocin  Labor: Progressing normally Preeclampsia: Just gestational HTN, normal urine P:C ration. no signs or symptoms of toxicity Fetal Wellbeing:  Category I Pain Control:  Planning on epidural when platelet result back Anticipated MOD:  NSVD  [name] 07/27/2011, 3:36 PM

## 2011-07-28 DIAGNOSIS — O139 Gestational [pregnancy-induced] hypertension without significant proteinuria, unspecified trimester: Secondary | ICD-10-CM

## 2011-07-28 LAB — CBC
MCH: 26.6 pg (ref 26.0–34.0)
MCHC: 32.2 g/dL (ref 30.0–36.0)
Platelets: 152 10*3/uL (ref 150–400)

## 2011-07-28 MED ORDER — SENNOSIDES-DOCUSATE SODIUM 8.6-50 MG PO TABS
2.0000 | ORAL_TABLET | Freq: Every day | ORAL | Status: DC
  Administered 2011-07-28: 2 via ORAL

## 2011-07-28 MED ORDER — IBUPROFEN 600 MG PO TABS
600.0000 mg | ORAL_TABLET | Freq: Four times a day (QID) | ORAL | Status: DC
  Administered 2011-07-28 – 2011-07-29 (×7): 600 mg via ORAL
  Filled 2011-07-28 (×7): qty 1

## 2011-07-28 MED ORDER — DIBUCAINE 1 % RE OINT: 1.0000 "application " | TOPICAL_OINTMENT | RECTAL | Status: DC | PRN

## 2011-07-28 MED ORDER — ONDANSETRON HCL 4 MG PO TABS: 4.0000 mg | ORAL_TABLET | ORAL | Status: DC | PRN

## 2011-07-28 MED ORDER — METHYLERGONOVINE MALEATE 0.2 MG/ML IJ SOLN: 0.2000 mg | INTRAMUSCULAR | Status: DC | PRN

## 2011-07-28 MED ORDER — SIMETHICONE 80 MG PO CHEW: 80.0000 mg | CHEWABLE_TABLET | ORAL | Status: DC | PRN

## 2011-07-28 MED ORDER — DIPHENHYDRAMINE HCL 25 MG PO CAPS: 25.0000 mg | ORAL_CAPSULE | Freq: Four times a day (QID) | ORAL | Status: DC | PRN

## 2011-07-28 MED ORDER — ZOLPIDEM TARTRATE 5 MG PO TABS: 5.0000 mg | ORAL_TABLET | Freq: Every evening | ORAL | Status: DC | PRN

## 2011-07-28 MED ORDER — PRENATAL MULTIVITAMIN CH
1.0000 | ORAL_TABLET | Freq: Every day | ORAL | Status: DC
  Administered 2011-07-28 – 2011-07-29 (×2): 1 via ORAL
  Filled 2011-07-28 (×2): qty 1

## 2011-07-28 MED ORDER — METHYLERGONOVINE MALEATE 0.2 MG PO TABS: 0.2000 mg | ORAL_TABLET | ORAL | Status: DC | PRN

## 2011-07-28 MED ORDER — LANOLIN HYDROUS EX OINT: TOPICAL_OINTMENT | CUTANEOUS | Status: DC | PRN

## 2011-07-28 MED ORDER — OXYCODONE-ACETAMINOPHEN 5-325 MG PO TABS
1.0000 | ORAL_TABLET | ORAL | Status: DC | PRN
  Administered 2011-07-28 (×3): 1 via ORAL
  Administered 2011-07-28 (×4): 2 via ORAL
  Administered 2011-07-28: 1 via ORAL
  Administered 2011-07-29 (×3): 2 via ORAL
  Filled 2011-07-28 (×3): qty 2
  Filled 2011-07-28 (×4): qty 1
  Filled 2011-07-28 (×4): qty 2

## 2011-07-28 MED ORDER — LAMOTRIGINE 100 MG PO TABS
100.0000 mg | ORAL_TABLET | Freq: Two times a day (BID) | ORAL | Status: DC
  Administered 2011-07-28 – 2011-07-29 (×4): 100 mg via ORAL
  Filled 2011-07-28 (×7): qty 1

## 2011-07-28 MED ORDER — WITCH HAZEL-GLYCERIN EX PADS: 1.0000 "application " | MEDICATED_PAD | CUTANEOUS | Status: DC | PRN

## 2011-07-28 MED ORDER — BENZOCAINE-MENTHOL 20-0.5 % EX AERO
1.0000 "application " | INHALATION_SPRAY | CUTANEOUS | Status: DC | PRN
  Administered 2011-07-28: 1 via TOPICAL
  Filled 2011-07-28: qty 56

## 2011-07-28 MED ORDER — ONDANSETRON HCL 4 MG/2ML IJ SOLN: 4.0000 mg | INTRAMUSCULAR | Status: DC | PRN

## 2011-07-28 MED ORDER — TETANUS-DIPHTH-ACELL PERTUSSIS 5-2.5-18.5 LF-MCG/0.5 IM SUSP: 0.5000 mL | Freq: Once | INTRAMUSCULAR | Status: DC

## 2011-07-28 NOTE — Progress Notes (Signed)
Clinical Social Work Department PSYCHOSOCIAL ASSESSMENT - MATERNAL/CHILD 07/28/2011  Patient:  [name],[name]  Account Number:  [id number]  Admit Date:  07/27/2011  Childs Name:   Jonah    Clinical Social Worker:  Rashaan Wyles, LCSW   Date/Time:  07/28/2011 10:00 AM  Date Referred:  07/28/2011   Referral source  Physician     Referred reason  Behavioral Health Issues   Other referral source:    I:  FAMILY / HOME ENVIRONMENT Child's legal guardian:  PARENT  Guardian - Name Guardian - Age Guardian - Address  Fidela Porcher 26 [address] [place] [address]  Darryl     Other household support members/support persons Name Relationship DOB  Christian BROTHER 4yo   Other support:    II  PSYCHOSOCIAL DATA Information Source:  Patient Interview  Financial and Community Resources Employment:   Financial resources:  Medicaid If Medicaid - County:  [place] Other  WIC  Food Stamps   School / Grade:   Maternity Care Coordinator / Child Services Coordination / Early Interventions:  Cultural issues impacting care:    III  STRENGTHS Strengths  Adequate Resources  Home prepared for Child (including basic supplies)  Compliance with medical plan  Supportive family/friends   Strength comment:    IV  RISK FACTORS AND CURRENT PROBLEMS Current Problem:  None   Risk Factor & Current Problem Patient Issue Family Issue Risk Factor / Current Problem Comment   N N     V  SOCIAL WORK ASSESSMENT CSW spoke with MOB and FOB at bedside.  MOB reports no current concerns with anxiety or depression symptoms.  MOB reported she was diagnosed with bipolar about 1 year ago and was prescribed abilify and xanax which she has not been taking during pregnancy.  MOB plans to start medication back now that infant has been born.  She goes to [facility] for medication services and reports seeing a psychiatrist as well which she plans to continue counseling.  MOB has WIC and foodstamps through  medicaid for financial support. No concerns reported by MOB and FOB as far as resources or supplies for infant.  Both MOB and FOB report supportive family and friends.  No hx of drug use and no current concerns.  Please reconsult CSW if further needs arise.      VI SOCIAL WORK PLAN Social Work Plan  No Further Intervention Required / No Barriers to Discharge   Type of pt/family education:   If child protective services report - county:   If child protective services report - date:   Information/referral to community resources comment:   Other social work plan:    

## 2011-07-28 NOTE — Progress Notes (Signed)
Patient seen and examined.  Agree with above note.  Levie Heritage, DO 07/28/2011 7:55 AM

## 2011-07-28 NOTE — Progress Notes (Signed)
Post Partum Day 1 Subjective: no complaints, up ad lib, voiding and tolerating PO  [name] is a G4 now P2112 mom who delivered a healthy baby boy at 39 wks + 4 days after an IOL for gestational hypertension.  She is formula feeding her son.  Objective: Blood pressure 139/85, pulse 88, temperature 98.9 F (37.2 C), temperature source Oral, resp. rate 18, height 5\' 5"  (1.651 m), weight 88.451 kg (195 lb).  Temp:  [98 F (36.7 C)-98.9 F (37.2 C)] 98.9 F (37.2 C) (07/20 0516) Pulse Rate:  [73-104] 88  (07/20 0516) Resp:  [18-20] 18  (07/20 0516) BP: (112-173)/(69-111) 139/85 mmHg (07/20 0516) SpO2:  [99 %-100 %] 99 % (07/19 1652) Weight:  [88.451 kg (195 lb)] 88.451 kg (195 lb) (07/19 0800)   Physical Exam:  General: alert, cooperative and fatigued Lochia: appropriate - still fairly heavy Uterine Fundus: firm  DVT Evaluation: No evidence of DVT seen on physical exam.   Basename 07/28/11 0500 07/27/11 2310  HGB 9.3* 9.7*  HCT 28.9* 29.5*    Assessment/Plan: Plan for discharge tomorrow and Contraception - she plans to get a BTL 4-5 weeks after discharge  Gestational HTN w/o evidence of pre-eclampsia: BPs post-partum have been normal.   LOS: 1 day   Junious Silk S 07/28/2011, 7:16 AM

## 2011-07-28 NOTE — Anesthesia Postprocedure Evaluation (Signed)
  Anesthesia Post-op Note  Patient: [name]  Procedure(s) Performed: * No procedures listed *  Patient Location: PACU and Mother/Baby  Anesthesia Type: Epidural  Level of Consciousness: awake, alert  and oriented  Airway and Oxygen Therapy: Patient Spontanous Breathing    Post-op Assessment: Patient's Cardiovascular Status Stable and Respiratory Function Stable  Post-op Vital Signs: stable  Complications: No apparent anesthesia complications

## 2011-07-29 MED ORDER — IBUPROFEN 600 MG PO TABS: 600.0000 mg | ORAL_TABLET | Freq: Four times a day (QID) | ORAL | Status: AC

## 2011-07-29 NOTE — Discharge Summary (Signed)
Obstetric Discharge Summary  [name] is a G4 now P2112 mom who delivered a healthy baby boy at 39 wks + 4 days after an IOL for gestational hypertension. She is formula feeding her son.  Her blood pressures have been good postpartum.  Reason for Admission: induction of labor Prenatal Procedures: none Intrapartum Procedures: spontaneous vaginal delivery Postpartum Procedures: none Complications-Operative and Postpartum: none Hemoglobin  Date Value Range Status  07/28/2011 9.3* 12.0 - 15.0 g/dL Final     HCT  Date Value Range Status  07/28/2011 28.9* 36.0 - 46.0 % Final   Temp:  [97.8 F (36.6 C)-97.9 F (36.6 C)] 97.8 F (36.6 C) (07/21 0505) Pulse Rate:  [84-91] 84  (07/21 0505) Resp:  [18] 18  (07/21 0505) BP: (118-132)/(77-84) 118/77 mmHg (07/21 0505)   Physical Exam:  General: alert, cooperative and no distress Lochia: appropriate Uterine Fundus: firm DVT Evaluation: No evidence of DVT seen on physical exam.  Discharge Diagnoses: Term Pregnancy-delivered  Discharge Information: Date: 07/29/2011 Activity: pelvic rest Diet: routine Medications: Ibuprofen and home Lamictal Condition: stable Instructions: Refer to discharge instructions Discharge to: home  Follow-up Information    Follow up with Tilda Burrow, MD in 1 week. (To discuss getting your tubes tied)    Contact information:   [facility] [address] [place] [address] [phone]     Newborn Data: Live born female  Birth Weight: 6 lb 10.2 oz (3011 g) APGAR: 9, 9  Home with mother.  [name] 07/29/2011, 9:05 AM  I have seen and examined this patient and I agree with the above. [name] 10:02 AM 07/30/2011   [name] is a 26 y.o. female presenting for induction of labor at 39+ weeks gestation due to gestational hypertension and history of fetal demise in utero at with first pregnancy 2005, benign pregnancy at term with second  pregnancy in 2009. Nonstress tests have been reactive. The patient had marked pelvic pain and pressure symptoms in last 4 weeks of pregnancy,  Since delivery she has done markedly better.

## 2011-07-30 NOTE — Progress Notes (Signed)
Post discharge chart review completed.  

## 2011-08-22 ENCOUNTER — Encounter (HOSPITAL_COMMUNITY): Payer: Self-pay | Admitting: Pharmacy Technician

## 2011-08-23 ENCOUNTER — Encounter (HOSPITAL_COMMUNITY): Payer: Self-pay | Admitting: Pharmacy Technician

## 2011-08-28 ENCOUNTER — Encounter (HOSPITAL_COMMUNITY): Admission: RE | Admit: 2011-08-28 | Payer: Medicaid Other | Source: Ambulatory Visit

## 2011-09-01 ENCOUNTER — Emergency Department (HOSPITAL_COMMUNITY)
Admission: EM | Admit: 2011-09-01 | Discharge: 2011-09-01 | Disposition: A | Discharge status: Home / Self Care | Payer: Medicaid Other | Attending: Emergency Medicine | Admitting: Emergency Medicine

## 2011-09-01 ENCOUNTER — Encounter (HOSPITAL_COMMUNITY): Payer: Self-pay | Admitting: *Deleted

## 2011-09-01 DIAGNOSIS — R52 Pain, unspecified: Secondary | ICD-10-CM | POA: Insufficient documentation

## 2011-09-01 DIAGNOSIS — M791 Myalgia, unspecified site: Secondary | ICD-10-CM

## 2011-09-01 DIAGNOSIS — I10 Essential (primary) hypertension: Secondary | ICD-10-CM | POA: Insufficient documentation

## 2011-09-01 DIAGNOSIS — R51 Headache: Secondary | ICD-10-CM | POA: Insufficient documentation

## 2011-09-01 DIAGNOSIS — Z79899 Other long term (current) drug therapy: Secondary | ICD-10-CM | POA: Insufficient documentation

## 2011-09-01 MED ORDER — NAPROXEN 500 MG PO TABS: 500.0000 mg | ORAL_TABLET | Freq: Two times a day (BID) | ORAL | Status: DC

## 2011-09-01 MED ORDER — METOCLOPRAMIDE HCL 5 MG/ML IJ SOLN
10.0000 mg | Freq: Once | INTRAMUSCULAR | Status: AC
  Administered 2011-09-01: 10 mg via INTRAMUSCULAR
  Filled 2011-09-01: qty 2

## 2011-09-01 MED ORDER — KETOROLAC TROMETHAMINE 60 MG/2ML IM SOLN
60.0000 mg | Freq: Once | INTRAMUSCULAR | Status: AC
  Administered 2011-09-01: 60 mg via INTRAMUSCULAR
  Filled 2011-09-01: qty 2

## 2011-09-01 MED ORDER — DIPHENHYDRAMINE HCL 50 MG/ML IJ SOLN
25.0000 mg | Freq: Once | INTRAMUSCULAR | Status: AC
  Administered 2011-09-01: 25 mg via INTRAMUSCULAR
  Filled 2011-09-01: qty 1

## 2011-09-01 NOTE — ED Provider Notes (Signed)
History   This chart was scribed for Glynn Octave, MD scribed by Magnus Sinning. The patient was seen in room APA15/APA15 at 22:14   CSN: [id number]  Arrival date & time 09/01/11  2136   First MD Initiated Contact with Patient 09/01/11 2210      Chief Complaint  Patient presents with  . Headache  . Generalized Body Aches    (Consider location/radiation/quality/duration/timing/severity/associated sxs/prior treatment) The history is provided by the patient. No language interpreter was used.   [name] is a 26 y.o. female who presents to the Emergency Department complaining of constant moderate HA with associated n/v, and generalized body aches, all onset yesterday. States she has arthritis, which is causing body aches and states she has hx of migraines and today's sxs are similar to previous HA. States she usually treats HAs with Imitrex, which she states she's taken today with no relief. Denies fevers, blurry vision, or double vision. Hx of DDD, HTN, which she treats with HCTZ.   Past Medical History  Diagnosis Date  . Seizures     Epilepsy  . Hypertension     no meds  . Mental disorder     Bi Polar  . Abnormal Pap smear     Past Surgical History  Procedure Date  . Dilation and curettage of uterus 2007  . Appendectomy 1995  . Colposcopy     Family History  Problem Relation Age of Onset  . Cancer Maternal Grandmother     breast  . Stroke Maternal Grandmother   . Stroke Maternal Grandfather   . Stroke Paternal Grandmother   . Stroke Paternal Grandfather   . Autism Cousin     History  Substance Use Topics  . Smoking status: Former Games developer  . Smokeless tobacco: Not on file  . Alcohol Use: No    OB History    Grav Para Term Preterm Abortions TAB SAB Ect Mult Living   4 3 2 1 1  1   2       Review of Systems 10 Systems reviewed and are negative for acute change except as noted in the HPI. Allergies  Morphine and related  Home Medications    Current Outpatient Rx  Name Route Sig Dispense Refill  . ACYCLOVIR 400 MG PO TABS Oral Take 400 mg by mouth 2 (two) times daily as needed. For breakout    . ALPRAZOLAM 0.5 MG PO TABS Oral Take 0.5 mg by mouth 3 (three) times daily as needed. For anxiety    . HYDROCHLOROTHIAZIDE 25 MG PO TABS Oral Take 25 mg by mouth daily.    Marland Kitchen HYDROCODONE-ACETAMINOPHEN 7.5-325 MG PO TABS Oral Take 1 tablet by mouth every 6 (six) hours as needed. For pain    . LAMOTRIGINE 100 MG PO TABS Oral Take 100 mg by mouth 2 (two) times daily.    Marland Kitchen OXYMETAZOLINE HCL 0.05 % NA SOLN Nasal Place 2 sprays into the nose 2 (two) times daily as needed. For allergies    . CHILDRENS MULTI VITAMINS/IRON PO Oral Take 2 tablets by mouth daily.    Marland Kitchen ZOLPIDEM TARTRATE 10 MG PO TABS Oral Take 10 mg by mouth at bedtime as needed. For sleep      BP 150/107  Pulse 64  Temp 97.8 F (36.6 C) (Oral)  Resp 18  Ht 5\' 5"  (1.651 m)  Wt 176 lb (79.833 kg)  BMI 29.29 kg/m2  SpO2 99%  Breastfeeding? No  Physical Exam  Nursing note and vitals  reviewed. Constitutional: She is oriented to person, place, and time. She appears well-developed and well-nourished. No distress.  HENT:  Head: Normocephalic and atraumatic.  Eyes: Conjunctivae and EOM are normal.  Neck: Neck supple. No tracheal deviation present.       No meningismus   Cardiovascular: Normal rate.   Pulmonary/Chest: Effort normal. No respiratory distress.       No effusions  Abdominal: She exhibits no distension.  Musculoskeletal: Normal range of motion.       Full ROM of all joints.  Neurological: She is alert and oriented to person, place, and time. No sensory deficit.  Skin: Skin is dry.       No cellulitis.  Psychiatric: She has a normal mood and affect. Her behavior is normal.    ED Course  Procedures (including critical care time) DIAGNOSTIC STUDIES: Oxygen Saturation is 99% on room air, normal by my interpretation.    COORDINATION OF CARE:  Labs Reviewed -  No data to display No results found.   No diagnosis found.    MDM  Gradual onset headache similar to previous migraines associated with nausea, vomiting, photophobia.  Also diffuse body aches from "arthritis".  Normal neurological exam, no meningismus. FROM all major joints without overlying skin change or effusion.  Symptoms improved with headache cocktail. Stable for outpatient followup.  I personally performed the services described in this documentation, which was scribed in my presence.  The recorded information has been reviewed and considered.        Glynn Octave, MD 09/03/11 1143

## 2011-09-01 NOTE — ED Notes (Signed)
Pt c/o headache x 2 days with nausea and "severe arthritis pain" all over.

## 2011-09-01 NOTE — ED Notes (Addendum)
Pt reports having a head x2 days. Denies taking anything for her headache today.  Pt states she has had some nausea & vomiting.

## 2011-09-01 NOTE — ED Notes (Signed)
Pt alert & oriented x4, stable gait. Patient given discharge instructions, paperwork & prescription(s). Patient  instructed to stop at the registration desk to finish any additional paperwork. Patient verbalized understanding. Pt left department w/ no further questions. 

## 2011-09-11 ENCOUNTER — Inpatient Hospital Stay (HOSPITAL_COMMUNITY): Admission: RE | Admit: 2011-09-11 | Discharge: 2011-09-11 | Payer: Medicaid Other | Source: Ambulatory Visit

## 2011-09-11 NOTE — Patient Instructions (Addendum)
[name]  09/11/2011   Your procedure is scheduled on:   09/18/2011  Report to [facility] at  1020  AM.  Call this number if you have problems the morning of surgery: [phone]   Remember:   Do not eat food:After Midnight.  May have clear liquids:until Midnight .    Take these medicines the morning of surgery with A SIP OF WATER: none   Do not wear jewelry, make-up or nail polish.  Do not wear lotions, powders, or perfumes. You may wear deodorant.  Do not shave 48 hours prior to surgery. Men may shave face and neck.  Do not bring valuables to the hospital.  Contacts, dentures or bridgework may not be worn into surgery.  Leave suitcase in the car. After surgery it may be brought to your room.  For patients admitted to the hospital, checkout time is 11:00 AM the day of discharge.   Patients discharged the day of surgery will not be allowed to drive home.  Name and phone number of your driver: family  Special Instructions: CHG Shower Use Special Wash: 1/2 bottle night before surgery and 1/2 bottle morning of surgery.   Please read over the following fact sheets that you were given: Pain Booklet, MRSA Information, Surgical Site Infection Prevention, Anesthesia Post-op Instructions and Care and Recovery After Surgery Tubal Ligation Reversal Care After Tubal ligation is tying or blocking of the Fallopian tubes to prevent pregnancy. Tubal Ligation Reversal (TLR) is opening the tubes and reversing the tubal ligation procedure originally performed. It is usually done by microsurgery. A TLR is either done because the woman wants to get pregnant or because she may have a condition known as Tubal Ligation Syndrome, which is:  Irregular, heavy and painful menstrual periods.   Worsening premenstrual syndrome.   Tubal (ectopic) pregnancy.   Symptoms of early menopause, including:   Loss of sex drive.   Hot and cold flashes.   Anxiety.   Palpitations of the heart.    Trouble sleeping (insomnia).   Mood swings.   Vaginal dryness.  The pregnancy rate for TLR is 20-70% depending on the type of tubal ligation that was done, age, and how much damage was done to the Fallopian tubes. RISKS AND COMPLICTIONS  Failure to reverse the original procedure.   Not getting pregnant.   Tubal (ectopic) pregnancy.   Bleeding.   Infection.   Injury to surrounding organs.   Blood clots of the legs and chest.   Problems with the anesthesia.   Allergic reaction to medication.  PROCEDURE After arriving at the hospital, you will change into a long gown. An IV (intravenous) will be placed in your arm and a medication will be given to relax you. Then, you will be put to sleep and a Foley catheter will be placed in your bladder. Your caregiver will work to open your tubes with microsurgery technique.  AFTER THE PROCEDURE  You will be groggy for a couple of hours.   You will be given pain medication if needed. An antibiotic will be given to prevent infection.   The intravenous and catheter will be removed before you are discharged.   Have someone available to take you home.   Healing takes time. You will be tender and have some swelling and bruising around the cut (incision).  HOME CARE INSTRUCTIONS   Take all your medications as instructed. Change from prescription pain medication to over-the-counter medication on the advice of your  caregiver.   Take showers instead of baths until advised otherwise.   Resume your usual diet.   Get plenty of rest and sleep.   Change bandages (dressings) as instructed.   Return to your caregiver for removal of sutures and follow-up visits as instructed.   Do not drive.   Do not lift anything over 5 pounds until your caregiver gives you permission.   Do not have sexual intercourse until your caregiver gives you permission.   Do not use tampons or douche.   Try to have help for the first 7 to 10 days for your  household needs.   Take your temperature twice a day and write it down.  SEEK MEDICAL CARE IF:   There is swelling, redness or increasing pain in the incision area.   Pus is coming from the incision.   There is a bad smell coming from the incision or bandage.   The incision is breaking open.   You develop a rash or reaction to your medications.   You feel sick to your stomach (nauseous) and throw up (vomiting).  SEEK IMMEDIATE MEDICAL CARE IF:   An unexplained oral temperature above 102 F (38.9 C) develops.   You faint or pass out.   You develop leg pain.   You develop chest pain.   You become short of breath.   You develop increasing pain in your belly (abdomen).   You have burning or pain when you urinate.   You develop heavy vaginal bleeding and it is not time for your menstrual period.  Document Released: 10/22/2008 Document Revised: 12/14/2010 Document Reviewed: 10/22/2008 [facility] Patient Information 2012 [place], [place].PATIENT INSTRUCTIONS POST-ANESTHESIA  IMMEDIATELY FOLLOWING SURGERY:  Do not drive or operate machinery for the first twenty four hours after surgery.  Do not make any important decisions for twenty four hours after surgery or while taking narcotic pain medications or sedatives.  If you develop intractable nausea and vomiting or a severe headache please notify your doctor immediately.  FOLLOW-UP:  Please make an appointment with your surgeon as instructed. You do not need to follow up with anesthesia unless specifically instructed to do so.  WOUND CARE INSTRUCTIONS (if applicable):  Keep a dry clean dressing on the anesthesia/puncture wound site if there is drainage.  Once the wound has quit draining you may leave it open to air.  Generally you should leave the bandage intact for twenty four hours unless there is drainage.  If the epidural site drains for more than 36-48 hours please call the anesthesia department.  QUESTIONS?:  Please feel free to  call your physician or the hospital operator if you have any questions, and they will be happy to assist you.

## 2011-09-12 ENCOUNTER — Other Ambulatory Visit: Payer: Self-pay | Admitting: Obstetrics and Gynecology

## 2011-09-12 ENCOUNTER — Other Ambulatory Visit: Payer: Self-pay

## 2011-09-12 ENCOUNTER — Encounter (HOSPITAL_COMMUNITY): Payer: Self-pay

## 2011-09-12 ENCOUNTER — Encounter (HOSPITAL_COMMUNITY)
Admission: RE | Admit: 2011-09-12 | Discharge: 2011-09-12 | Disposition: A | Discharge status: Home / Self Care | Payer: Medicaid Other | Source: Ambulatory Visit | Attending: Obstetrics and Gynecology | Admitting: Obstetrics and Gynecology

## 2011-09-12 HISTORY — DX: Anemia, unspecified: D64.9

## 2011-09-12 HISTORY — DX: Unspecified osteoarthritis, unspecified site: M19.90

## 2011-09-12 LAB — CBC
HCT: 35.8 % — ABNORMAL LOW (ref 36.0–46.0)
MCHC: 32.7 g/dL (ref 30.0–36.0)
Platelets: 191 10*3/uL (ref 150–400)
RDW: 15.3 % (ref 11.5–15.5)
WBC: 4.4 10*3/uL (ref 4.0–10.5)

## 2011-09-12 LAB — BASIC METABOLIC PANEL
Calcium: 9.2 mg/dL (ref 8.4–10.5)
GFR calc Af Amer: >90 mL/min (ref >90)
GFR calc non Af Amer: >90 mL/min (ref >90)
Potassium: 4.1 mEq/L (ref 3.5–5.1)
Sodium: 139 mEq/L (ref 135–145)

## 2011-09-12 LAB — SURGICAL PCR SCREEN
MRSA, PCR: NEGATIVE
Staphylococcus aureus: POSITIVE — AB

## 2011-09-12 LAB — HCG, SERUM, QUALITATIVE: Preg, Serum: NEGATIVE

## 2011-09-12 NOTE — Progress Notes (Signed)
09/12/11 1145  OBSTRUCTIVE SLEEP APNEA  Have you ever been diagnosed with sleep apnea through a sleep study? No  Do you snore loudly (loud enough to be heard through closed doors)?  1  Do you often feel tired, fatigued, or sleepy during the daytime? 1  Has anyone observed you stop breathing during your sleep? 1  Do you have, or are you being treated for high blood pressure? 1  BMI more than 35 kg/m2? 0  Age over 28 years old? 0  Neck circumference greater than 40 cm/18 inches? 0  Gender: 0  Obstructive Sleep Apnea Score 4   Score 4 or greater  Updated health history;Results sent to PCP

## 2011-09-17 NOTE — H&P (Signed)
[name] is an 26 y.o. female. She is admitted for tubal sterilization she recently sign Medicaid tubal sterilization forms 08/01/2011. She is recently status post her most recent vaginal delivery and desires to proceed with permanent sterilization  Pertinent Gynecological History: Menses: flow is moderate Bleeding: Regular normal Contraception: abstinence DES exposure: unknown Blood transfusions: none Sexually transmitted diseases: no past history Previous GYN Procedures: None  Last mammogram: Not applicable Date: Last pap: normal Date: 2012 OB History: G4, P2112 she lost one baby due to abruption   Menstrual History: Menarche age: 50 No LMP recorded.    Past Medical History  Diagnosis Date  . Hypertension     no meds  . Mental disorder     Bi Polar  . Abnormal Pap smear   . Seizures     Epilepsy-no seizures 7 months ago  . Arthritis   . Anemia     Past Surgical History  Procedure Date  . Dilation and curettage of uterus 2007  . Appendectomy 1995  . Colposcopy     Family History  Problem Relation Age of Onset  . Cancer Maternal Grandmother     breast  . Stroke Maternal Grandmother   . Stroke Maternal Grandfather   . Stroke Paternal Grandmother   . Stroke Paternal Grandfather   . Autism Cousin     Social History:  reports that she quit smoking about 2 years ago. Her smoking use included Cigarettes. She has a 3 pack-year smoking history. She does not have any smokeless tobacco history on file. She reports that she does not drink alcohol or use illicit drugs.  Allergies:  Allergies  Allergen Reactions  . Morphine And Related Hives    Pt tolerates Percocet.    No prescriptions prior to admission    Review of Systems  Constitutional: Negative.   HENT: Negative.   Cardiovascular: Negative.   Gastrointestinal: Negative.     not currently breastfeeding. weight 178.2 blood pressure elevated 160/100 no recent blood pressure medicines  taken Physical Exam  Constitutional: She is oriented to person, place, and time. She appears well-developed and well-nourished.  HENT:  Head: Normocephalic.  Eyes: Pupils are equal, round, and reactive to light.  Neck: Neck supple.  Cardiovascular: Normal rate and regular rhythm.   Respiratory: Effort normal and breath sounds normal.  GI: Soft.  Genitourinary: Vagina normal and uterus normal. Guaiac stool: adnexa nontender.  Neurological: She is alert and oriented to person, place, and time.  Psychiatric: She has a normal mood and affect.       Some what somber in affect    No results found for this or any previous visit (from the past 24 hour(s)).  No results found.  Assessment/Plan: Desire for elective permanent sterilization Anxiety disorder Right clavicle arthritis Plan laparoscopic tubal sterilization of Falope-Rings 09/18/2011 risks of procedure reviewed with patient surgical technique reviewed  [name] V 09/17/2011, 9:56 PM

## 2011-09-18 ENCOUNTER — Encounter (HOSPITAL_COMMUNITY): Payer: Self-pay | Admitting: Anesthesiology

## 2011-09-18 ENCOUNTER — Ambulatory Visit (HOSPITAL_COMMUNITY): Payer: Medicaid Other | Admitting: Anesthesiology

## 2011-09-18 ENCOUNTER — Encounter (HOSPITAL_COMMUNITY): Payer: Self-pay

## 2011-09-18 ENCOUNTER — Ambulatory Visit (HOSPITAL_COMMUNITY)
Admission: RE | Admit: 2011-09-18 | Discharge: 2011-09-18 | Disposition: A | Discharge status: Home / Self Care | Payer: Medicaid Other | Source: Ambulatory Visit | Attending: Obstetrics and Gynecology | Admitting: Obstetrics and Gynecology

## 2011-09-18 ENCOUNTER — Encounter (HOSPITAL_COMMUNITY)
Admission: RE | Disposition: A | Discharge status: Home / Self Care | Payer: Self-pay | Source: Ambulatory Visit | Attending: Obstetrics and Gynecology

## 2011-09-18 DIAGNOSIS — Z01810 Encounter for preprocedural cardiovascular examination: Secondary | ICD-10-CM | POA: Insufficient documentation

## 2011-09-18 DIAGNOSIS — I10 Essential (primary) hypertension: Secondary | ICD-10-CM | POA: Insufficient documentation

## 2011-09-18 DIAGNOSIS — Z01812 Encounter for preprocedural laboratory examination: Secondary | ICD-10-CM | POA: Insufficient documentation

## 2011-09-18 DIAGNOSIS — Z302 Encounter for sterilization: Secondary | ICD-10-CM | POA: Insufficient documentation

## 2011-09-18 HISTORY — PX: LAPAROSCOPIC TUBAL LIGATION: SHX1937

## 2011-09-18 SURGERY — LIGATION, FALLOPIAN TUBE, LAPAROSCOPIC
Anesthesia: General | Laterality: Bilateral | Wound class: Clean Contaminated

## 2011-09-18 MED ORDER — PROPOFOL 10 MG/ML IV BOLUS
INTRAVENOUS | Status: DC | PRN
  Administered 2011-09-18: 150 mg via INTRAVENOUS
  Administered 2011-09-18: 50 mg via INTRAVENOUS

## 2011-09-18 MED ORDER — ONDANSETRON HCL 4 MG/2ML IJ SOLN
INTRAMUSCULAR | Status: AC
  Filled 2011-09-18: qty 2

## 2011-09-18 MED ORDER — ONDANSETRON HCL 4 MG/2ML IJ SOLN
4.0000 mg | Freq: Once | INTRAMUSCULAR | Status: AC | PRN
  Administered 2011-09-18: 4 mg via INTRAVENOUS

## 2011-09-18 MED ORDER — LACTATED RINGERS IV SOLN
INTRAVENOUS | Status: DC
  Administered 2011-09-18: 11:00:00 via INTRAVENOUS

## 2011-09-18 MED ORDER — OXYCODONE-ACETAMINOPHEN 10-325 MG PO TABS: 1.0000 | ORAL_TABLET | ORAL | Status: AC | PRN

## 2011-09-18 MED ORDER — BUPIVACAINE HCL (PF) 0.25 % IJ SOLN
INTRAMUSCULAR | Status: AC
  Filled 2011-09-18: qty 30

## 2011-09-18 MED ORDER — LIDOCAINE HCL (CARDIAC) 10 MG/ML IV SOLN
INTRAVENOUS | Status: DC | PRN
  Administered 2011-09-18: 10 mg via INTRAVENOUS

## 2011-09-18 MED ORDER — MIDAZOLAM HCL 2 MG/2ML IJ SOLN
INTRAMUSCULAR | Status: AC
  Filled 2011-09-18: qty 2

## 2011-09-18 MED ORDER — PROPOFOL 10 MG/ML IV EMUL
INTRAVENOUS | Status: AC
  Filled 2011-09-18: qty 20

## 2011-09-18 MED ORDER — LIDOCAINE HCL (PF) 1 % IJ SOLN
INTRAMUSCULAR | Status: AC
  Filled 2011-09-18: qty 5

## 2011-09-18 MED ORDER — FENTANYL CITRATE 0.05 MG/ML IJ SOLN
INTRAMUSCULAR | Status: AC
  Filled 2011-09-18: qty 2

## 2011-09-18 MED ORDER — GLYCOPYRROLATE 0.2 MG/ML IJ SOLN
INTRAMUSCULAR | Status: DC | PRN
  Administered 2011-09-18: 0.4 mg via INTRAVENOUS

## 2011-09-18 MED ORDER — FENTANYL CITRATE 0.05 MG/ML IJ SOLN
INTRAMUSCULAR | Status: DC | PRN
  Administered 2011-09-18: 100 ug via INTRAVENOUS

## 2011-09-18 MED ORDER — 0.9 % SODIUM CHLORIDE (POUR BTL) OPTIME
TOPICAL | Status: DC | PRN
  Administered 2011-09-18: 1000 mL

## 2011-09-18 MED ORDER — FENTANYL CITRATE 0.05 MG/ML IJ SOLN
25.0000 ug | INTRAMUSCULAR | Status: DC | PRN
  Administered 2011-09-18 (×3): 50 ug via INTRAVENOUS

## 2011-09-18 MED ORDER — ROCURONIUM BROMIDE 50 MG/5ML IV SOLN
INTRAVENOUS | Status: AC
  Filled 2011-09-18: qty 1

## 2011-09-18 MED ORDER — ONDANSETRON HCL 4 MG/2ML IJ SOLN
4.0000 mg | Freq: Once | INTRAMUSCULAR | Status: AC
  Administered 2011-09-18: 4 mg via INTRAVENOUS

## 2011-09-18 MED ORDER — BUPIVACAINE HCL (PF) 0.5 % IJ SOLN
INTRAMUSCULAR | Status: AC
  Filled 2011-09-18: qty 30

## 2011-09-18 MED ORDER — GLYCOPYRROLATE 0.2 MG/ML IJ SOLN
INTRAMUSCULAR | Status: AC
  Filled 2011-09-18: qty 2

## 2011-09-18 MED ORDER — MIDAZOLAM HCL 2 MG/2ML IJ SOLN
1.0000 mg | INTRAMUSCULAR | Status: DC | PRN
  Administered 2011-09-18 (×2): 2 mg via INTRAVENOUS

## 2011-09-18 MED ORDER — NEOSTIGMINE METHYLSULFATE 1 MG/ML IJ SOLN
INTRAMUSCULAR | Status: DC | PRN
  Administered 2011-09-18: 2 mg via INTRAVENOUS

## 2011-09-18 MED ORDER — BUPIVACAINE HCL (PF) 0.25 % IJ SOLN
INTRAMUSCULAR | Status: DC | PRN
  Administered 2011-09-18: 10 mL

## 2011-09-18 MED ORDER — FENTANYL CITRATE 0.05 MG/ML IJ SOLN
25.0000 ug | INTRAMUSCULAR | Status: AC | PRN
  Administered 2011-09-18 (×2): 25 ug via INTRAVENOUS

## 2011-09-18 MED ORDER — ROCURONIUM BROMIDE 100 MG/10ML IV SOLN
INTRAVENOUS | Status: DC | PRN
  Administered 2011-09-18: 30 mg via INTRAVENOUS

## 2011-09-18 SURGICAL SUPPLY — 36 items
BAG HAMPER (MISCELLANEOUS) ×2 IMPLANT
BLADE SURG SZ11 CARB STEEL (BLADE) ×2 IMPLANT
CATH ROBINSON RED A/P 16FR (CATHETERS) ×2 IMPLANT
CLOTH BEACON ORANGE TIMEOUT ST (SAFETY) ×2 IMPLANT
COVER LIGHT HANDLE STERIS (MISCELLANEOUS) ×4 IMPLANT
DECANTER SPIKE VIAL GLASS SM (MISCELLANEOUS) ×2 IMPLANT
DRESSING COVERLET 3X1 FLEXIBLE (GAUZE/BANDAGES/DRESSINGS) ×3 IMPLANT
ELECT REM PT RETURN 9FT ADLT (ELECTROSURGICAL) ×2
ELECTRODE REM PT RTRN 9FT ADLT (ELECTROSURGICAL) ×1 IMPLANT
GLOVE BIOGEL PI IND STRL 6.5 (GLOVE) IMPLANT
GLOVE BIOGEL PI INDICATOR 6.5 (GLOVE) ×1
GLOVE ECLIPSE 9.0 STRL (GLOVE) ×2 IMPLANT
GLOVE INDICATOR STER SZ 9 (GLOVE) ×2 IMPLANT
GOWN STRL REIN 3XL LVL4 (GOWN DISPOSABLE) ×2 IMPLANT
GOWN STRL REIN XL XLG (GOWN DISPOSABLE) ×3 IMPLANT
INST SET LAPROSCOPIC GYN AP (KITS) ×2 IMPLANT
KIT ROOM TURNOVER AP CYSTO (KITS) ×2 IMPLANT
MANIFOLD NEPTUNE II (INSTRUMENTS) ×2 IMPLANT
NDL INSUFFLATION 14GA 120MM (NEEDLE) IMPLANT
NEEDLE INSUFFLATION 14GA 120MM (NEEDLE) ×2 IMPLANT
NS IRRIG 1000ML POUR BTL (IV SOLUTION) ×2 IMPLANT
PACK PERI GYN (CUSTOM PROCEDURE TRAY) ×2 IMPLANT
PAD ARMBOARD 7.5X6 YLW CONV (MISCELLANEOUS) ×2 IMPLANT
RING FALLOPIAN BANDS (Ring) ×2 IMPLANT
SET BASIN LINEN APH (SET/KITS/TRAYS/PACK) ×2 IMPLANT
SOL PREP PROV IODINE SCRUB 4OZ (MISCELLANEOUS) ×2 IMPLANT
SOLUTION ANTI FOG 6CC (MISCELLANEOUS) ×2 IMPLANT
STRIP CLOSURE SKIN 1/2X4 (GAUZE/BANDAGES/DRESSINGS) ×1 IMPLANT
STRIP CLOSURE SKIN 1/4X3 (GAUZE/BANDAGES/DRESSINGS) ×2 IMPLANT
SUT VIC AB 4-0 PS2 27 (SUTURE) ×1 IMPLANT
SYR BULB IRRIGATION 50ML (SYRINGE) ×2 IMPLANT
SYR CONTROL 10ML LL (SYRINGE) ×2 IMPLANT
TROCAR KII 8X100ML NONTHREADED (TROCAR) ×2 IMPLANT
TROCAR Z-THREAD FIOS 5X100MM (TROCAR) ×2 IMPLANT
TUBING INSUFFLATION HIGH FLOW (TUBING) ×2 IMPLANT
WARMER LAPAROSCOPE (MISCELLANEOUS) ×2 IMPLANT

## 2011-09-18 NOTE — Anesthesia Procedure Notes (Signed)
Procedure Name: Intubation Date/Time: 09/18/2011 1:13 PM Performed by: Franco Nones Pre-anesthesia Checklist: Patient identified, Patient being monitored, Timeout performed, Emergency Drugs available and Suction available Patient Re-evaluated:Patient Re-evaluated prior to inductionOxygen Delivery Method: Circle System Utilized Preoxygenation: Pre-oxygenation with 100% oxygen Intubation Type: IV induction Ventilation: Mask ventilation without difficulty Laryngoscope Size: Miller and 2 Grade View: Grade I Tube type: Oral Tube size: 7.0 mm Number of attempts: 1 Airway Equipment and Method: stylet Placement Confirmation: ETT inserted through vocal cords under direct vision,  positive ETCO2 and breath sounds checked- equal and bilateral Secured at: 21 cm Tube secured with: Tape Dental Injury: Teeth and Oropharynx as per pre-operative assessment

## 2011-09-18 NOTE — Anesthesia Postprocedure Evaluation (Signed)
Anesthesia Post Note  Patient: [name]  Procedure(s) Performed: Procedure(s) (LRB): LAPAROSCOPIC TUBAL LIGATION (Bilateral)  Anesthesia type: General  Patient location: PACU  Post pain: Pain level controlled  Post assessment: Post-op Vital signs reviewed, Patient's Cardiovascular Status Stable, Respiratory Function Stable, Patent Airway, No signs of Nausea or vomiting and Pain level controlled  Last Vitals:  Filed Vitals:   09/18/11 1348  BP: 127/88  Pulse: 82  Temp: 36.6 C  Resp: 18    Post vital signs: Reviewed and stable  Level of consciousness: awake and alert   Complications: No apparent anesthesia complications

## 2011-09-18 NOTE — Interval H&P Note (Signed)
History and Physical Interval Note:  09/18/2011 12:34 PM  [name]  has presented today for surgery, with the diagnosis of sterilization request  The various methods of treatment have been discussed with the patient and family. After consideration of risks, benefits and other options for treatment, the patient has consented to  Procedure(s) (LRB) with comments: LAPAROSCOPIC TUBAL LIGATION (Bilateral) - laparoscopic bilateral tubal ligation with falope rings as a surgical intervention .  The patient's history has been reviewed, patient examined, no change in status, stable for surgery.  I have reviewed the patient's chart and labs.  Questions were answered to the patient's satisfaction.  She has used condom contraception since delivery, and has had no episodes of unprotected sex.  Pregnancy test is negative.   [name]

## 2011-09-18 NOTE — Op Note (Signed)
See operative details included in brief operative note 

## 2011-09-18 NOTE — Transfer of Care (Signed)
Immediate Anesthesia Transfer of Care Note  Patient: [name]  Procedure(s) Performed: Procedure(s) (LRB): LAPAROSCOPIC TUBAL LIGATION (Bilateral)  Patient Location: PACU  Anesthesia Type: General  Level of Consciousness: awake  Airway & Oxygen Therapy: Patient Spontanous Breathing and non-rebreather face mask  Post-op Assessment: Report given to PACU RN, Post -op Vital signs reviewed and stable and Patient moving all extremities  Post vital signs: Reviewed and stable  Complications: No apparent anesthesia complications

## 2011-09-18 NOTE — Anesthesia Preprocedure Evaluation (Signed)
Anesthesia Evaluation  Patient identified by MRN, date of birth, ID band Patient awake    Reviewed: Allergy & Precautions, H&P , NPO status , Patient's Chart, lab work & pertinent test results, reviewed documented beta blocker date and time   History of Anesthesia Complications Negative for: history of anesthetic complications  Airway Mallampati: III TM Distance: >3 FB Neck ROM: full  Mouth opening: Limited Mouth Opening  Dental  (+) Teeth Intact   Pulmonary former smoker,  breath sounds clear to auscultation        Cardiovascular hypertension (PIH), Rhythm:regular Rate:Normal     Neuro/Psych Seizures - (seizure disorder, last seizure 7 months ago),  PSYCHIATRIC DISORDERS (bipolar d/o) Bipolar Disorder negative psych ROS   GI/Hepatic negative GI ROS, Neg liver ROS,   Endo/Other  negative endocrine ROS  Renal/GU negative Renal ROS  negative genitourinary   Musculoskeletal   Abdominal   Peds  Hematology negative hematology ROS (+) Blood dyscrasia (hgb 10), anemia ,   Anesthesia Other Findings   Reproductive/Obstetrics (+) Pregnancy                           Anesthesia Physical Anesthesia Plan  ASA: II  Anesthesia Plan: General   Post-op Pain Management:    Induction: Intravenous  Airway Management Planned: Oral ETT  Additional Equipment:   Intra-op Plan:   Post-operative Plan: Extubation in OR  Informed Consent: I have reviewed the patients History and Physical, chart, labs and discussed the procedure including the risks, benefits and alternatives for the proposed anesthesia with the patient or authorized representative who has indicated his/her understanding and acceptance.     Plan Discussed with:   Anesthesia Plan Comments:         Anesthesia Quick Evaluation

## 2011-09-18 NOTE — Brief Op Note (Signed)
09/18/2011  1:40 PM  PATIENT:  [name]  26 y.o. female  PRE-OPERATIVE DIAGNOSIS:  desires permanent sterilization  POST-OPERATIVE DIAGNOSIS:  desires permanent sterilization  PROCEDURE:  Procedure(s) (LRB) with comments: LAPAROSCOPIC TUBAL LIGATION (Bilateral) - laparoscopic bilateral tubal ligation with falope rings  SURGEON:  Surgeon(s) and Role:    * Tilda Burrow, MD - Primary  PHYSICIAN ASSISTANT:   ASSISTANTS: none   ANESTHESIA:   local and general  EBL:  Total I/O In: 600 [I.V.:600] Out: 60 [Urine:50; Blood:10]  BLOOD ADMINISTERED:none  DRAINS: none   LOCAL MEDICATIONS USED:  MARCAINE    and Amount: 5 ml  SPECIMEN:  No Specimen  DISPOSITION OF SPECIMEN:  N/A  COUNTS:  YES  TOURNIQUET:  * No tourniquets in log *  DICTATION: .Dragon Dictation This was taken operating room, prepped and draped for combined abdominal and vaginal procedure with in and out catheterization the bladder and Hulka tenaculum attached the cervix for uterine manipulation. Timeout was conducted. An infraumbilical vertical 1 cm skin incision was made as well as a transverse suprapubic incision of similar length. Veress needle was used to achieve pneumoperitoneum, and the abdomen was insufflated to 3 L CO2 then a 5 mm laparoscopic trocar inserted through the umbilicus. Pneumoperitoneum was confirmed as atraumatic, and attention directed to the suprapubic trocar which was inserted under direct visualization.. Each fallopian tube was identified to its fimbriated end, grasped elevated and the midsegment knuckle of tube incarcerated in the Falope ring with Marcaine injected in the incarcerated portion of tube as well as beneath it and the mesosalpinx. The abdomen was deflated, 120 cc of saline instilled into the abdomen to assist with evacuation of carbon dioxide, and the agent hyperventilated x2 to assist with gas expulsion, as the and the peritoneum was released. The trochars were removed and  subcuticular 4-0 Vicryl skin incision closure completed the procedure sponge and needle counts were correct patient to recovery room in stable condition  PLAN OF CARE: Discharge to home after PACU  PATIENT DISPOSITION:  PACU - hemodynamically stable.   Delay start of Pharmacological VTE agent (>24hrs) due to surgical blood loss or risk of bleeding: no Not applicable

## 2011-09-20 ENCOUNTER — Encounter (HOSPITAL_COMMUNITY): Payer: Self-pay | Admitting: Obstetrics and Gynecology

## 2012-01-16 ENCOUNTER — Other Ambulatory Visit: Payer: Self-pay | Admitting: Obstetrics & Gynecology

## 2012-01-16 ENCOUNTER — Other Ambulatory Visit (HOSPITAL_COMMUNITY)
Admission: RE | Admit: 2012-01-16 | Discharge: 2012-01-16 | Disposition: A | Discharge status: Home / Self Care | Payer: Medicaid Other | Source: Ambulatory Visit | Attending: Obstetrics & Gynecology | Admitting: Obstetrics & Gynecology

## 2012-01-16 DIAGNOSIS — Z01419 Encounter for gynecological examination (general) (routine) without abnormal findings: Secondary | ICD-10-CM | POA: Insufficient documentation

## 2012-03-31 ENCOUNTER — Emergency Department (HOSPITAL_COMMUNITY)
Admission: EM | Admit: 2012-03-31 | Discharge: 2012-03-31 | Disposition: A | Discharge status: Home / Self Care | Payer: Medicaid Other | Attending: Emergency Medicine | Admitting: Emergency Medicine

## 2012-03-31 ENCOUNTER — Encounter (HOSPITAL_COMMUNITY): Payer: Self-pay | Admitting: *Deleted

## 2012-03-31 DIAGNOSIS — G40909 Epilepsy, unspecified, not intractable, without status epilepticus: Secondary | ICD-10-CM | POA: Insufficient documentation

## 2012-03-31 DIAGNOSIS — M549 Dorsalgia, unspecified: Secondary | ICD-10-CM | POA: Insufficient documentation

## 2012-03-31 DIAGNOSIS — M791 Myalgia, unspecified site: Secondary | ICD-10-CM

## 2012-03-31 DIAGNOSIS — Z87891 Personal history of nicotine dependence: Secondary | ICD-10-CM | POA: Insufficient documentation

## 2012-03-31 DIAGNOSIS — Z8739 Personal history of other diseases of the musculoskeletal system and connective tissue: Secondary | ICD-10-CM | POA: Insufficient documentation

## 2012-03-31 DIAGNOSIS — Z862 Personal history of diseases of the blood and blood-forming organs and certain disorders involving the immune mechanism: Secondary | ICD-10-CM | POA: Insufficient documentation

## 2012-03-31 DIAGNOSIS — M25519 Pain in unspecified shoulder: Secondary | ICD-10-CM | POA: Insufficient documentation

## 2012-03-31 DIAGNOSIS — F489 Nonpsychotic mental disorder, unspecified: Secondary | ICD-10-CM | POA: Insufficient documentation

## 2012-03-31 DIAGNOSIS — Z79899 Other long term (current) drug therapy: Secondary | ICD-10-CM | POA: Insufficient documentation

## 2012-03-31 DIAGNOSIS — M25559 Pain in unspecified hip: Secondary | ICD-10-CM | POA: Insufficient documentation

## 2012-03-31 DIAGNOSIS — M25569 Pain in unspecified knee: Secondary | ICD-10-CM | POA: Insufficient documentation

## 2012-03-31 DIAGNOSIS — G8929 Other chronic pain: Secondary | ICD-10-CM | POA: Insufficient documentation

## 2012-03-31 DIAGNOSIS — I10 Essential (primary) hypertension: Secondary | ICD-10-CM | POA: Insufficient documentation

## 2012-03-31 HISTORY — DX: Fibromyalgia: M79.7

## 2012-03-31 MED ORDER — TRAMADOL HCL 50 MG PO TABS: 50.0000 mg | ORAL_TABLET | Freq: Four times a day (QID) | ORAL | Status: DC | PRN

## 2012-03-31 NOTE — ED Notes (Signed)
Pain rt clavicle, shoulders, back and  Hips. Pt says she has fibromyalgia.  Hydrocodone has helped in the past.  Alert, NAD.

## 2012-03-31 NOTE — ED Provider Notes (Signed)
History    This chart was scribed for Glynn Octave, MD by Gerlean Ren, ED Scribe. This patient was seen in room APFT20/APFT20 and the patient's care was started at 1:06 PM    CSN: [id number]  Arrival date & time 03/31/12  1159   None     Chief Complaint  Patient presents with  . Back Pain     The history is provided by the patient. No language interpreter was used.  [name] is a 27 y.o. female who presents to the Emergency Department complaining of chronic pain in bilateral knees, hips, back, and bilateral shoulders.  Pain has been present for several years and was worsened today.  No recent injuries or traumas as cause for worsening pain.  Pt has not used OCM for recently worsened pain and states hydrocodone is only medicine that has helped pain in the past.  Pt denies fever, nausea, emesis.   Pt takes Lamictal for seizures.     Past Medical History  Diagnosis Date  . Hypertension     no meds  . Mental disorder     Bi Polar  . Abnormal Pap smear   . Seizures     Epilepsy-no seizures 7 months ago  . Arthritis   . Anemia     pt denies  . Fibromyalgia     Past Surgical History  Procedure Laterality Date  . Dilation and curettage of uterus  2007  . Appendectomy  1995  . Colposcopy    . Laparoscopic tubal ligation  09/18/2011    Procedure: LAPAROSCOPIC TUBAL LIGATION;  Surgeon: Tilda Burrow, MD;  Location: [facility];  Service: Gynecology;  Laterality: Bilateral;  laparoscopic bilateral tubal ligation with falope rings    Family History  Problem Relation Age of Onset  . Cancer Maternal Grandmother     breast  . Stroke Maternal Grandmother   . Stroke Maternal Grandfather   . Stroke Paternal Grandmother   . Stroke Paternal Grandfather   . Autism Cousin     History  Substance Use Topics  . Smoking status: Former Smoker -- 1.00 packs/day for 3 years    Types: Cigarettes    Quit date: 09/11/2009  . Smokeless tobacco: Not on file  . Alcohol Use: No     OB History   Grav Para Term Preterm Abortions TAB SAB Ect Mult Living   4 3 2 1 1  1   2       Review of Systems  Gastrointestinal: Negative for nausea and vomiting.  Musculoskeletal: Positive for back pain and arthralgias.  Skin: Negative for wound.  Neurological: Negative for weakness and numbness.  All other systems reviewed and are negative.    Allergies  Morphine and related  Home Medications   Current Outpatient Rx  Name  Route  Sig  Dispense  Refill  . ALPRAZolam (XANAX) 0.5 MG tablet   Oral   Take 0.5 mg by mouth 3 (three) times daily as needed. For anxiety         . amLODipine (NORVASC) 5 MG tablet   Oral   Take 5 mg by mouth daily.         Marland Kitchen lamoTRIgine (LAMICTAL) 100 MG tablet   Oral   Take 100 mg by mouth 2 (two) times daily.         Marland Kitchen oxymetazoline (AFRIN) 0.05 % nasal spray   Nasal   Place 2 sprays into the nose 2 (two) times daily as needed. For allergies         .  phentermine (ADIPEX-P) 37.5 MG tablet   Oral   Take 37.5 mg by mouth daily before breakfast.         . zolpidem (AMBIEN) 10 MG tablet   Oral   Take 10 mg by mouth at bedtime as needed. For sleep           BP 146/87  Pulse 109  Temp(Src) 97.6 F (36.4 C) (Oral)  Resp 18  Ht 5\' 5"  (1.651 m)  Wt 185 lb (83.915 kg)  BMI 30.79 kg/m2  SpO2 100%  LMP 03/31/2012  Breastfeeding? No  Physical Exam  Nursing note and vitals reviewed. Constitutional: She is oriented to person, place, and time. She appears well-developed and well-nourished. No distress.  HENT:  Head: Normocephalic and atraumatic.  Eyes: EOM are normal.  Neck: Neck supple. No tracheal deviation present.  Cardiovascular: Normal rate.   Intact distal pulses  Pulmonary/Chest: Effort normal. No respiratory distress.  Musculoskeletal: Normal range of motion.  Full ROM of all joints, no erythema, no effusions 5/5 strength in bilateral lower extremities. Ankle plantar and dorsiflexion intact. Great toe  extension intact bilaterally. +2 DP and PT pulses. +2 patellar reflexes bilaterally.   Neurological: She is alert and oriented to person, place, and time.  Skin: Skin is warm and dry.  Psychiatric: She has a normal mood and affect. Her behavior is normal.    ED Course  Procedures (including critical care time) DIAGNOSTIC STUDIES: Oxygen Saturation is 100% on room air, normal by my interpretation.    COORDINATION OF CARE: 1:10 PM- Informed pt that I cannot provide her with narcotic pain medications here because she drove and because she is caring for her child.  Discussed anti-inflammatories.  Pt understands and agrees.   No diagnosis found.    MDM  Patient reports generalized body pain in her bilateral shoulders, hips, back and knees for the past several days. She states she has arthritis or fibromyalgia. Denies change in her pain. Denies fever or chills. Patient states she's been on tramadol, ibuprofen, naproxen without relief.  Patient is nontoxic-appearing and has full range of motion of her joints. There is no evidence of septic joints. Explained to the patient that she would not be prescribed narcotics as she is caring for infant at this time and drove herself to the hospital. She is referred to rheumatology for further testing and workup.  I personally performed the services described in this documentation, which was scribed in my presence. The recorded information has been reviewed and is accurate.         Glynn Octave, MD 03/31/12 1335

## 2012-03-31 NOTE — ED Notes (Signed)
Hx of arthritis and fibromyalgia - having increased generalized pain today, worse in back and bil knees.

## 2012-05-13 ENCOUNTER — Encounter (HOSPITAL_COMMUNITY): Payer: Self-pay | Admitting: *Deleted

## 2012-05-13 ENCOUNTER — Emergency Department (HOSPITAL_COMMUNITY)
Admission: EM | Admit: 2012-05-13 | Discharge: 2012-05-13 | Disposition: A | Discharge status: Home / Self Care | Payer: Medicaid Other | Attending: Emergency Medicine | Admitting: Emergency Medicine

## 2012-05-13 DIAGNOSIS — Z87891 Personal history of nicotine dependence: Secondary | ICD-10-CM | POA: Insufficient documentation

## 2012-05-13 DIAGNOSIS — N390 Urinary tract infection, site not specified: Secondary | ICD-10-CM | POA: Insufficient documentation

## 2012-05-13 DIAGNOSIS — Z79899 Other long term (current) drug therapy: Secondary | ICD-10-CM | POA: Insufficient documentation

## 2012-05-13 DIAGNOSIS — G40909 Epilepsy, unspecified, not intractable, without status epilepticus: Secondary | ICD-10-CM | POA: Insufficient documentation

## 2012-05-13 DIAGNOSIS — Z862 Personal history of diseases of the blood and blood-forming organs and certain disorders involving the immune mechanism: Secondary | ICD-10-CM | POA: Insufficient documentation

## 2012-05-13 DIAGNOSIS — M129 Arthropathy, unspecified: Secondary | ICD-10-CM | POA: Insufficient documentation

## 2012-05-13 DIAGNOSIS — IMO0001 Reserved for inherently not codable concepts without codable children: Secondary | ICD-10-CM | POA: Insufficient documentation

## 2012-05-13 DIAGNOSIS — I10 Essential (primary) hypertension: Secondary | ICD-10-CM | POA: Insufficient documentation

## 2012-05-13 DIAGNOSIS — F319 Bipolar disorder, unspecified: Secondary | ICD-10-CM | POA: Insufficient documentation

## 2012-05-13 LAB — URINE MICROSCOPIC-ADD ON

## 2012-05-13 LAB — URINALYSIS, ROUTINE W REFLEX MICROSCOPIC
Bilirubin Urine: NEGATIVE
Glucose, UA: NEGATIVE mg/dL
Ketones, ur: NEGATIVE mg/dL
Specific Gravity, Urine: 1.015 (ref 1.005–1.030)
pH: 6 (ref 5.0–8.0)

## 2012-05-13 MED ORDER — HYDROMORPHONE HCL PF 1 MG/ML IJ SOLN
1.0000 mg | Freq: Once | INTRAMUSCULAR | Status: AC
  Administered 2012-05-13: 1 mg via INTRAMUSCULAR
  Filled 2012-05-13: qty 1

## 2012-05-13 MED ORDER — CEPHALEXIN 500 MG PO CAPS
500.0000 mg | ORAL_CAPSULE | Freq: Once | ORAL | Status: AC
  Administered 2012-05-13: 500 mg via ORAL
  Filled 2012-05-13: qty 1

## 2012-05-13 MED ORDER — HYDROCODONE-ACETAMINOPHEN 5-325 MG PO TABS: 1.0000 | ORAL_TABLET | Freq: Four times a day (QID) | ORAL | Status: DC | PRN

## 2012-05-13 MED ORDER — CEPHALEXIN 500 MG PO CAPS: 500.0000 mg | ORAL_CAPSULE | Freq: Four times a day (QID) | ORAL | Status: DC

## 2012-05-13 NOTE — ED Provider Notes (Signed)
History  This chart was scribed for [name], MD by Bennett Scrape, ED Scribe. This patient was seen in room APA11/APA11 and the patient's care was started at 1:45 PM.  CSN: [id number]  Arrival date & time 05/13/12  1252   First MD Initiated Contact with Patient 05/13/12 1342      Chief Complaint  Patient presents with  . Back Pain     Patient is a 27 y.o. female presenting with back pain. The history is provided by the patient. No language interpreter was used.  Back Pain Location:  Thoracic spine and lumbar spine Quality:  Cramping Radiates to:  Does not radiate Pain is:  Same all the time Onset quality:  Gradual Duration:  1 day Timing:  Constant Progression:  Waxing and waning Chronicity:  Chronic Context: not recent injury   Relieved by:  Bed rest Worsened by:  Twisting, bending and ambulation Ineffective treatments:  None tried Associated symptoms: no abdominal pain, no chest pain and no headaches     HPI Comments: [name] is a 27 y.o. female with a h/o arthritis and fibromyalgia who presents to the Emergency Department complaining of worsening of chronic back pain described as spasms since mowing the lawn yesterday. She denies any recent falls or trauma. She states that due to her conditions she has daily pain but decided to mow. She reports that she tried to call her PCP with no answer. She states that her PCP has put her Ultram, goody powders and ibuprofen in the past with no improvement. She was recommended to take prednisone but declined due to the possibility of weight gain. She reports similar episodes of the same and prior xrays of her back. She denies urinary symptoms, fevers, chills and urinary or bowel incontinence as associated symptoms.    Past Medical History  Diagnosis Date  . Hypertension     no meds  . Mental disorder     Bi Polar  . Abnormal Pap smear   . Seizures     Epilepsy-no seizures 7 months ago  . Arthritis   . Anemia      pt denies  . Fibromyalgia     Past Surgical History  Procedure Laterality Date  . Dilation and curettage of uterus  2007  . Appendectomy  1995  . Colposcopy    . Laparoscopic tubal ligation  09/18/2011    Procedure: LAPAROSCOPIC TUBAL LIGATION;  Surgeon: Tilda Burrow, MD;  Location: [facility];  Service: Gynecology;  Laterality: Bilateral;  laparoscopic bilateral tubal ligation with falope rings    Family History  Problem Relation Age of Onset  . Cancer Maternal Grandmother     breast  . Stroke Maternal Grandmother   . Stroke Maternal Grandfather   . Stroke Paternal Grandmother   . Stroke Paternal Grandfather   . Autism Cousin     History  Substance Use Topics  . Smoking status: Former Smoker -- 1.00 packs/day for 3 years    Types: Cigarettes    Quit date: 09/11/2009  . Smokeless tobacco: Not on file  . Alcohol Use: No    OB History   Grav Para Term Preterm Abortions TAB SAB Ect Mult Living   4 3 2 1 1  1   2       Review of Systems  Constitutional: Negative for appetite change and fatigue.  HENT: Negative for congestion, sinus pressure and ear discharge.   Eyes: Negative for discharge.  Respiratory: Negative for cough.  Cardiovascular: Negative for chest pain.  Gastrointestinal: Negative for abdominal pain and diarrhea.  Genitourinary: Negative for frequency and hematuria.  Musculoskeletal: Positive for back pain.  Skin: Negative for rash.  Neurological: Negative for seizures and headaches.  Psychiatric/Behavioral: Negative for hallucinations.    Allergies  Morphine and related  Home Medications   Current Outpatient Rx  Name  Route  Sig  Dispense  Refill  . ALPRAZolam (XANAX) 0.5 MG tablet   Oral   Take 0.5 mg by mouth 3 (three) times daily as needed. For anxiety         . amLODipine (NORVASC) 5 MG tablet   Oral   Take 5 mg by mouth daily.         Marland Kitchen lamoTRIgine (LAMICTAL) 100 MG tablet   Oral   Take 100 mg by mouth 2 (two) times daily.          Marland Kitchen oxymetazoline (AFRIN) 0.05 % nasal spray   Nasal   Place 2 sprays into the nose 2 (two) times daily as needed. For allergies         . phentermine (ADIPEX-P) 37.5 MG tablet   Oral   Take 37.5 mg by mouth daily before breakfast.         . traMADol (ULTRAM) 50 MG tablet   Oral   Take 1 tablet (50 mg total) by mouth every 6 (six) hours as needed for pain.   15 tablet   0   . zolpidem (AMBIEN) 10 MG tablet   Oral   Take 10 mg by mouth at bedtime as needed. For sleep           Triage Vitals: BP 141/91  Pulse 127  Temp(Src) 98.4 F (36.9 C) (Oral)  Resp 18  SpO2 100%  LMP 04/29/2012  Breastfeeding? No  Physical Exam  Nursing note and vitals reviewed. Constitutional: She is oriented to person, place, and time. She appears well-developed and well-nourished.  HENT:  Head: Normocephalic and atraumatic.  Eyes: Conjunctivae and EOM are normal. No scleral icterus.  Neck: Neck supple. No thyromegaly present.  Cardiovascular: Normal rate and regular rhythm.  Exam reveals no gallop and no friction rub.   No murmur heard. Pulmonary/Chest: No stridor. She has no wheezes. She has no rales. She exhibits tenderness (mild tenderness over right clavicle ).  Abdominal: She exhibits no distension. There is no tenderness. There is no rebound.  Musculoskeletal: Normal range of motion. She exhibits no edema.  Mild tenderness over thoracic and lumbar spine, no crepitance   Lymphadenopathy:    She has no cervical adenopathy.  Neurological: She is oriented to person, place, and time. Coordination normal.  Skin: No rash noted. No erythema.  Psychiatric: She has a normal mood and affect. Her behavior is normal.    ED Course  Procedures (including critical care time)  Medications  HYDROmorphone (DILAUDID) injection 1 mg (not administered)    DIAGNOSTIC STUDIES: Oxygen Saturation is 100% on room air, normal by my interpretation.    COORDINATION OF CARE: 1:49 PM-Advised pt that  no radiology images are needed due to this being a chronic conditions. Discussed treatment plan which includes pain medication and UA with pt at bedside and pt agreed to plan.   Labs Reviewed - No data to display No results found.   No diagnosis found.    MDM        The chart was scribed for me under my direct supervision.  I personally performed the history, physical, and  medical decision making and all procedures in the evaluation of this patient.[name], MD 05/13/12 1630

## 2012-05-13 NOTE — ED Notes (Signed)
Pt states that she mowed the lawn yesterday and ever since has been experiencing body pain that is worse than normal. She has a history of arthritis and fibromyalgia. Pt also states that her skin is sensitive to touch.

## 2012-05-13 NOTE — ED Notes (Signed)
Back pain , pain in arms and legs since mowing yard yesterday.  Hx of fibromyalgia

## 2012-05-15 LAB — URINE CULTURE

## 2012-05-16 ENCOUNTER — Telehealth (HOSPITAL_COMMUNITY): Payer: Self-pay | Admitting: Emergency Medicine

## 2012-05-16 NOTE — ED Notes (Signed)
Post ED Visit - Positive Culture Follow-up  Culture report reviewed by antimicrobial stewardship pharmacist: []  [name], Pharm.D., BCPS [x]  [name], Pharm.D., BCPS []  [name], Pharm.D., BCPS []  [name], [address].D., BCPS, AAHIVP []  [name], Pharm.D., BCPS, AAHIV  Positive urine culture Treated with keflex, organism sensitive to the same and no further patient follow-up is required at this time.  [name] 05/16/2012, 2:22 PM

## 2012-07-01 ENCOUNTER — Other Ambulatory Visit: Payer: Self-pay | Admitting: *Deleted

## 2012-07-01 MED ORDER — ZOLPIDEM TARTRATE 10 MG PO TABS: 10.0000 mg | ORAL_TABLET | Freq: Every evening | ORAL | Status: DC | PRN

## 2012-07-29 ENCOUNTER — Other Ambulatory Visit: Payer: Self-pay | Admitting: Obstetrics and Gynecology

## 2012-07-30 ENCOUNTER — Other Ambulatory Visit: Payer: Self-pay | Admitting: Obstetrics and Gynecology

## 2012-07-30 DIAGNOSIS — F411 Generalized anxiety disorder: Secondary | ICD-10-CM

## 2012-07-31 ENCOUNTER — Telehealth: Payer: Self-pay | Admitting: *Deleted

## 2012-07-31 ENCOUNTER — Telehealth: Payer: Self-pay | Admitting: Obstetrics and Gynecology

## 2012-07-31 NOTE — Telephone Encounter (Signed)
Pt called back and left a duplicate message.

## 2012-08-04 ENCOUNTER — Telehealth: Payer: Self-pay | Admitting: Obstetrics and Gynecology

## 2012-08-11 ENCOUNTER — Encounter: Payer: Self-pay | Admitting: Obstetrics and Gynecology

## 2012-08-11 ENCOUNTER — Ambulatory Visit (INDEPENDENT_AMBULATORY_CARE_PROVIDER_SITE_OTHER): Payer: Medicaid Other | Admitting: Obstetrics and Gynecology

## 2012-08-11 VITALS — BP 148/100 | Ht 65.0 in | Wt 170.8 lb

## 2012-08-11 DIAGNOSIS — F4310 Post-traumatic stress disorder, unspecified: Secondary | ICD-10-CM | POA: Insufficient documentation

## 2012-08-11 DIAGNOSIS — F411 Generalized anxiety disorder: Secondary | ICD-10-CM

## 2012-08-11 DIAGNOSIS — F419 Anxiety disorder, unspecified: Secondary | ICD-10-CM

## 2012-08-11 MED ORDER — ALPRAZOLAM 0.5 MG PO TABS: ORAL_TABLET | ORAL | Status: DC

## 2012-08-11 MED ORDER — ZOLPIDEM TARTRATE 10 MG PO TABS: 10.0000 mg | ORAL_TABLET | Freq: Every evening | ORAL | Status: DC | PRN

## 2012-08-11 NOTE — Progress Notes (Signed)
Patient ID: [name], female   DOB: Oct 20, 1985, 27 y.o.   MRN: [id number] Pt here today to discuss medication refills. Sees Dr Rush Landmark at [facility]. Sees Dr Rosetta Posner for BP  Meds. Pt tried on Trazodone and Seroquel by Dr Rush Landmark, pt perceived experience as poor compared to Xanax  Pt will be given 90 day refil of current meds, xanax and Ambien to allow for transition.. No SI, HI.  Functioning well in role of caregiver. Good interaction with 27 yr old.

## 2012-08-11 NOTE — Patient Instructions (Addendum)
Please see Dr Rush Landmark and discuss future resuming of meds refils. Take a letter to the doctor describing how you respond to the meds you are taking, and the ones you did poorly on.

## 2012-08-12 ENCOUNTER — Telehealth: Payer: Self-pay | Admitting: Obstetrics and Gynecology

## 2012-08-12 NOTE — Telephone Encounter (Signed)
Pt requesting Hydrocodone for fibromyalgia and arthritis. Encouraged pt to go to her Primary care provider, pt states Dr. Olena Leatherwood would not give her a RX for med, "Dr. Olena Leatherwood does not like to RX narcotics" Informed pt would route message to Dr. Emelda Fear.

## 2012-08-13 NOTE — Telephone Encounter (Signed)
She did not mention pain meds when in direct conversation over med requests earlier this week, and was specifically asked: are there any other med issues? I will NOT be the easy access to opiates.

## 2012-08-13 NOTE — Telephone Encounter (Signed)
Pt informed Dr. Emelda Fear will not give RX for Hydrocodone.

## 2012-09-27 ENCOUNTER — Encounter (HOSPITAL_COMMUNITY): Payer: Self-pay

## 2012-09-27 ENCOUNTER — Emergency Department (HOSPITAL_COMMUNITY)
Admission: EM | Admit: 2012-09-27 | Discharge: 2012-09-27 | Disposition: A | Discharge status: Home / Self Care | Payer: Medicaid Other | Attending: Emergency Medicine | Admitting: Emergency Medicine

## 2012-09-27 DIAGNOSIS — G479 Sleep disorder, unspecified: Secondary | ICD-10-CM | POA: Insufficient documentation

## 2012-09-27 DIAGNOSIS — G40909 Epilepsy, unspecified, not intractable, without status epilepticus: Secondary | ICD-10-CM | POA: Insufficient documentation

## 2012-09-27 DIAGNOSIS — Z87891 Personal history of nicotine dependence: Secondary | ICD-10-CM | POA: Insufficient documentation

## 2012-09-27 DIAGNOSIS — F3189 Other bipolar disorder: Secondary | ICD-10-CM | POA: Insufficient documentation

## 2012-09-27 DIAGNOSIS — F4310 Post-traumatic stress disorder, unspecified: Secondary | ICD-10-CM | POA: Insufficient documentation

## 2012-09-27 DIAGNOSIS — I10 Essential (primary) hypertension: Secondary | ICD-10-CM | POA: Insufficient documentation

## 2012-09-27 DIAGNOSIS — IMO0001 Reserved for inherently not codable concepts without codable children: Secondary | ICD-10-CM | POA: Insufficient documentation

## 2012-09-27 DIAGNOSIS — M129 Arthropathy, unspecified: Secondary | ICD-10-CM | POA: Insufficient documentation

## 2012-09-27 DIAGNOSIS — G8929 Other chronic pain: Secondary | ICD-10-CM | POA: Insufficient documentation

## 2012-09-27 DIAGNOSIS — Z862 Personal history of diseases of the blood and blood-forming organs and certain disorders involving the immune mechanism: Secondary | ICD-10-CM | POA: Insufficient documentation

## 2012-09-27 MED ORDER — HYDROCODONE-ACETAMINOPHEN 5-325 MG PO TABS
1.0000 | ORAL_TABLET | Freq: Once | ORAL | Status: AC
  Administered 2012-09-27: 1 via ORAL
  Filled 2012-09-27: qty 1

## 2012-09-27 NOTE — ED Provider Notes (Signed)
CSN: [id number]     Arrival date & time 09/27/12  1158 History  This chart was scribed for Lyanne Co, MD by Allene Dillon, ED Scribe. This patient was seen in room APA11/APA11 and the patient's care was started at 12:28 PM.    Chief Complaint  Patient presents with  . Generalized Body Aches    The history is provided by the patient. No language interpreter was used.   HPI Comments: [name] is a 27 y.o. female who presents to the Emergency Department complaining of generalized body aches due to her Fibromyalgia and athritis flaring up which began two days ago.Pt states pain worsened yesterday. Pt has taken Ultram, ibuprofen  and Naproxen without relief.  She was previously prescribed 7.5mg  hydrocondone. Pt reports her PCP was unavailable for an appointment yesterday to provide a refill.    Past Medical History  Diagnosis Date  . Hypertension     no meds  . Mental disorder     Bi Polar  . Abnormal Pap smear   . Seizures     Epilepsy-no seizures 7 months ago  . Arthritis   . Anemia     pt denies  . Fibromyalgia   . Bipolar disorder     type 2  . Sleep disorder   . PTSD (post-traumatic stress disorder)    Past Surgical History  Procedure Laterality Date  . Dilation and curettage of uterus  2007  . Appendectomy  1995  . Colposcopy    . Laparoscopic tubal ligation  09/18/2011    Procedure: LAPAROSCOPIC TUBAL LIGATION;  Surgeon: Tilda Burrow, MD;  Location: [facility];  Service: Gynecology;  Laterality: Bilateral;  laparoscopic bilateral tubal ligation with falope rings   Family History  Problem Relation Age of Onset  . Cancer Maternal Grandmother     breast  . Stroke Maternal Grandmother   . Stroke Maternal Grandfather   . Cancer Maternal Grandfather     prancreatic  . Stroke Paternal Grandmother   . Stroke Paternal Grandfather   . Cancer Paternal Grandfather     bone  . Autism Cousin   . Other Mother     degenerative joint disorder  . Hypertension  Father    History  Substance Use Topics  . Smoking status: Former Smoker -- 1.00 packs/day for 3 years    Types: Cigarettes    Quit date: 09/11/2009  . Smokeless tobacco: Not on file  . Alcohol Use: No   OB History   Grav Para Term Preterm Abortions TAB SAB Ect Mult Living   4 3 2 1 1  1   2      Review of Systems A complete 10 system review of systems was obtained and all systems are negative except as noted in the HPI and PMH.    Allergies  Morphine and related  Home Medications   Current Outpatient Rx  Name  Route  Sig  Dispense  Refill  . ALPRAZolam (XANAX) 0.5 MG tablet      TAKE 1 TABLET BY MOUTH EVERY 12 HOURS AS NEEDED FOR ANXIETY   60 tablet   2   . amLODipine (NORVASC) 5 MG tablet   Oral   Take 5 mg by mouth daily.         Marland Kitchen HYDROcodone-acetaminophen (NORCO/VICODIN) 5-325 MG per tablet   Oral   Take 1 tablet by mouth every 6 (six) hours as needed for pain.   20 tablet   0   .  lamoTRIgine (LAMICTAL) 100 MG tablet   Oral   Take 100 mg by mouth 2 (two) times daily.         Marland Kitchen losartan-hydrochlorothiazide (HYZAAR) 100-25 MG per tablet   Oral   Take 1 tablet by mouth daily.         . traZODone (DESYREL) 100 MG tablet   Oral   Take 100 mg by mouth at bedtime.         Marland Kitchen zolpidem (AMBIEN) 10 MG tablet   Oral   Take 1 tablet (10 mg total) by mouth at bedtime as needed for sleep.   30 tablet   2    Triage Vitals: BP 153/89  Pulse 95  Resp 18  Ht 5\' 5"  (1.651 m)  Wt 170 lb (77.111 kg)  BMI 28.29 kg/m2  SpO2 98%  LMP 09/13/2012 Physical Exam  Nursing note and vitals reviewed. Constitutional: She is oriented to person, place, and time. She appears well-developed and well-nourished. No distress.  HENT:  Head: Normocephalic and atraumatic.  Eyes: EOM are normal.  Neck: Normal range of motion.  Cardiovascular: Normal rate, regular rhythm and normal heart sounds.   Pulmonary/Chest: Effort normal and breath sounds normal.  Abdominal: Soft.  She exhibits no distension. There is no tenderness.  Musculoskeletal: Normal range of motion.  Neurological: She is alert and oriented to person, place, and time.  Skin: Skin is warm and dry.  Psychiatric: She has a normal mood and affect. Judgment normal.    ED Course  Procedures (including critical care time) DIAGNOSTIC STUDIES: Oxygen Saturation is 98% on RA, normal by my interpretation.    COORDINATION OF CARE: 12:30 PM- Will give hydrocodone in the ED. Pt advised of plan for treatment and pt agrees.    Labs Review Labs Reviewed - No data to display Imaging Review No results found.  MDM   1. Chronic pain      I personally performed the services described in this documentation, which was scribed in my presence. The recorded information has been reviewed and is accurate.      Lyanne Co, MD 09/27/12 [phone]

## 2012-09-27 NOTE — ED Notes (Signed)
Pt reports has fibromyalgia and osteoarthritis.  Reports has flare up of pain x 2 days.  PT also c/o headache x 2 days.

## 2012-11-07 NOTE — Telephone Encounter (Signed)
Nurse left message 

## 2013-04-17 NOTE — Telephone Encounter (Signed)
close

## 2013-11-09 ENCOUNTER — Encounter (HOSPITAL_COMMUNITY): Payer: Self-pay

## 2014-07-08 DIAGNOSIS — F3181 Bipolar II disorder: Secondary | ICD-10-CM | POA: Diagnosis not present

## 2014-08-01 ENCOUNTER — Encounter (HOSPITAL_COMMUNITY): Payer: Self-pay | Admitting: Emergency Medicine

## 2014-08-01 ENCOUNTER — Emergency Department (HOSPITAL_COMMUNITY)
Admission: EM | Admit: 2014-08-01 | Discharge: 2014-08-01 | Disposition: A | Discharge status: Home / Self Care | Payer: Medicaid Other | Attending: Emergency Medicine | Admitting: Emergency Medicine

## 2014-08-01 ENCOUNTER — Emergency Department (HOSPITAL_COMMUNITY): Payer: Medicaid Other

## 2014-08-01 DIAGNOSIS — M25462 Effusion, left knee: Secondary | ICD-10-CM | POA: Diagnosis not present

## 2014-08-01 DIAGNOSIS — F4310 Post-traumatic stress disorder, unspecified: Secondary | ICD-10-CM | POA: Insufficient documentation

## 2014-08-01 DIAGNOSIS — F319 Bipolar disorder, unspecified: Secondary | ICD-10-CM | POA: Insufficient documentation

## 2014-08-01 DIAGNOSIS — Z862 Personal history of diseases of the blood and blood-forming organs and certain disorders involving the immune mechanism: Secondary | ICD-10-CM | POA: Insufficient documentation

## 2014-08-01 DIAGNOSIS — G40909 Epilepsy, unspecified, not intractable, without status epilepticus: Secondary | ICD-10-CM | POA: Insufficient documentation

## 2014-08-01 DIAGNOSIS — M1990 Unspecified osteoarthritis, unspecified site: Secondary | ICD-10-CM | POA: Insufficient documentation

## 2014-08-01 DIAGNOSIS — W010XXA Fall on same level from slipping, tripping and stumbling without subsequent striking against object, initial encounter: Secondary | ICD-10-CM | POA: Diagnosis not present

## 2014-08-01 DIAGNOSIS — Y998 Other external cause status: Secondary | ICD-10-CM | POA: Insufficient documentation

## 2014-08-01 DIAGNOSIS — Z87891 Personal history of nicotine dependence: Secondary | ICD-10-CM | POA: Diagnosis not present

## 2014-08-01 DIAGNOSIS — I10 Essential (primary) hypertension: Secondary | ICD-10-CM | POA: Diagnosis not present

## 2014-08-01 DIAGNOSIS — Y92008 Other place in unspecified non-institutional (private) residence as the place of occurrence of the external cause: Secondary | ICD-10-CM | POA: Insufficient documentation

## 2014-08-01 DIAGNOSIS — Y9389 Activity, other specified: Secondary | ICD-10-CM | POA: Diagnosis not present

## 2014-08-01 DIAGNOSIS — S8992XA Unspecified injury of left lower leg, initial encounter: Secondary | ICD-10-CM | POA: Diagnosis present

## 2014-08-01 DIAGNOSIS — Z79899 Other long term (current) drug therapy: Secondary | ICD-10-CM | POA: Insufficient documentation

## 2014-08-01 MED ORDER — PREDNISONE 50 MG PO TABS
60.0000 mg | ORAL_TABLET | Freq: Once | ORAL | Status: AC
  Administered 2014-08-01: 60 mg via ORAL
  Filled 2014-08-01 (×2): qty 1

## 2014-08-01 MED ORDER — MELOXICAM 15 MG PO TABS: 15.0000 mg | ORAL_TABLET | Freq: Every day | ORAL | Status: DC

## 2014-08-01 MED ORDER — ACETAMINOPHEN-CODEINE #3 300-30 MG PO TABS: 1.0000 | ORAL_TABLET | Freq: Four times a day (QID) | ORAL | Status: DC | PRN

## 2014-08-01 MED ORDER — DEXAMETHASONE 4 MG PO TABS: 4.0000 mg | ORAL_TABLET | Freq: Two times a day (BID) | ORAL | Status: DC

## 2014-08-01 MED ORDER — KETOROLAC TROMETHAMINE 10 MG PO TABS
10.0000 mg | ORAL_TABLET | Freq: Once | ORAL | Status: AC
  Administered 2014-08-01: 10 mg via ORAL
  Filled 2014-08-01: qty 1

## 2014-08-01 NOTE — ED Provider Notes (Signed)
CSN: [id number]     Arrival date & time 08/01/14  1651 History   This chart was scribed for Ivery Quale, PA-C working with Eber Hong, MD by Elveria Rising, ED Scribe. This patient was seen in room APFT23/APFT23 and the patient's care was started at 5:46 PM.   Chief Complaint  Patient presents with  . Knee Pain   The history is provided by the patient. No language interpreter was used.   HPI Comments: [name] is a 29 y.o. female with PMHx of arthritis who presents to the Emergency Department complaining of left knee pain resulting from two mechanical falls directly on knee within the last two weeks. Patient initially reports falling when everting her left ankle on unleveled ground in her back yard and secondly when tripping over a vacuum cleaner in her home. Patient reports pulling pain to posterior knee with flexion.  No history of surgeries to left knee.   Past Medical History  Diagnosis Date  . Hypertension     no meds  . Mental disorder     Bi Polar  . Abnormal Pap smear   . Seizures     Epilepsy-no seizures 7 months ago  . Arthritis   . Anemia     pt denies  . Fibromyalgia   . Bipolar disorder     type 2  . Sleep disorder   . PTSD (post-traumatic stress disorder)    Past Surgical History  Procedure Laterality Date  . Dilation and curettage of uterus  2007  . Appendectomy  1995  . Colposcopy    . Laparoscopic tubal ligation  09/18/2011    Procedure: LAPAROSCOPIC TUBAL LIGATION;  Surgeon: Tilda Burrow, MD;  Location: [facility];  Service: Gynecology;  Laterality: Bilateral;  laparoscopic bilateral tubal ligation with falope rings   Family History  Problem Relation Age of Onset  . Cancer Maternal Grandmother     breast  . Stroke Maternal Grandmother   . Stroke Maternal Grandfather   . Cancer Maternal Grandfather     prancreatic  . Stroke Paternal Grandmother   . Stroke Paternal Grandfather   . Cancer Paternal Grandfather     bone  . Autism Cousin   .  Other Mother     degenerative joint disorder  . Hypertension Father    History  Substance Use Topics  . Smoking status: Former Smoker -- 1.00 packs/day for 3 years    Types: Cigarettes    Quit date: 09/11/2009  . Smokeless tobacco: Not on file  . Alcohol Use: No   OB History    Gravida Para Term Preterm AB TAB SAB Ectopic Multiple Living   4 3 2 1 1  1   2      Review of Systems  Constitutional: Negative for fever.  Musculoskeletal: Positive for joint swelling and arthralgias.  Skin: Negative for wound.  Neurological: Negative for weakness and numbness.  All other systems reviewed and are negative.   Allergies  Ultram  Home Medications   Prior to Admission medications   Medication Sig Start Date End Date Taking? Authorizing Provider  ALPRAZolam (XANAX) 0.5 MG tablet TAKE 1 TABLET BY MOUTH EVERY 12 HOURS AS NEEDED FOR ANXIETY Patient taking differently: Take 0.5 mg by mouth 4 (four) times daily.  08/11/12  Yes Tilda Burrow, MD  amLODipine (NORVASC) 5 MG tablet Take 5 mg by mouth daily.   Yes Historical Provider, MD  lamoTRIgine (LAMICTAL) 150 MG tablet Take 300 mg by mouth daily.  Yes Historical Provider, MD  losartan-hydrochlorothiazide (HYZAAR) 100-25 MG per tablet Take 1 tablet by mouth daily.   Yes Historical Provider, MD  zolpidem (AMBIEN) 10 MG tablet Take 1 tablet (10 mg total) by mouth at bedtime as needed for sleep. 08/11/12  Yes Tilda Burrow, MD  HYDROcodone-acetaminophen (NORCO/VICODIN) 5-325 MG per tablet Take 1 tablet by mouth every 6 (six) hours as needed for pain. Patient not taking: Reported on 08/01/2014 05/13/12   Bethann Berkshire, MD   Triage Vitals: BP 126/86 mmHg  Pulse 96  Temp(Src) 99.2 F (37.3 C) (Oral)  Resp 20  Ht  (1.651 m)  Wt 180 lb (81.647 kg)  BMI 29.95 kg/m2  SpO2 100%  LMP 07/28/2014 Physical Exam  Constitutional: She is oriented to person, place, and time. She appears well-developed and well-nourished. No distress.  HENT:  Head:  Normocephalic and atraumatic.  Eyes: EOM are normal.  Neck: Neck supple. No tracheal deviation present.  Cardiovascular: Normal rate.   Pulmonary/Chest: Effort normal. No respiratory distress.  Musculoskeletal: Normal range of motion.  No quadricep deformity on the left. Mild to moderate effusion on the left. Left knee is warm, but not hot. No posterior mass in the left knee. Achilles is intact. Cap refill is < 2 seconds. Moderate lateral malleolus swelling on the left from previous injury.   Neurological: She is alert and oriented to person, place, and time.  Skin: Skin is warm and dry.  Psychiatric: She has a normal mood and affect. Her behavior is normal.  Nursing note and vitals reviewed.   ED Course  Procedures (including critical care time)  COORDINATION OF CARE: 5:46 PM- Discussed treatment plan with patient at bedside and patient agreed to plan.   Labs Review Labs Reviewed - No data to display  Imaging Review Dg Knee Complete 4 Views Left  08/01/2014   CLINICAL DATA:  29 year old who has fallen twice in the past 2 weeks due to weakness in the left ankle and hands injuring the left knee. Pain and limited range of motion. Initial encounter.  EXAM: LEFT KNEE - COMPLETE 4+ VIEW  COMPARISON:  None.  FINDINGS: No evidence of acute or subacute fracture or dislocation. Well preserved joint spaces. Well preserved bone mineral density. Small joint effusion suspected.  IMPRESSION: No osseous abnormality.  Small joint effusion suspected.   Electronically Signed   By: Hulan Saas M.D.   On: 08/01/2014 17:26     EKG Interpretation None      MDM  X-ray of the left knee is negative for fracture or dislocation. There is a small joint effusion present. The examination is negative for any gross neurovascular changes. The patient will not allow full evaluation of the knee due to pain and discomfort. Knee immobilizer was suggested, the patient states she has pain when the knee is fully  extended. Will use an ace wrap and crutches as well as ice. Prescription for low back, Tylenol codeine, and Decadron given to the patient. Patient is referred to Dr. Romeo Apple for additional evaluation and management of this orthopedic issue.    Final diagnoses:  None    *I have reviewed nursing notes, vital signs, and all appropriate lab and imaging results for this patient.*  **I personally performed the services described in this documentation, which was scribed in my presence. The recorded information has been reviewed and is accurate.   Ivery Quale, PA-C 08/01/14 1833  Eber Hong, MD 08/02/14 [phone]

## 2014-08-01 NOTE — Discharge Instructions (Signed)
The x-ray of your knee is negative for fracture or dislocation. The x-ray and the exam suggest fluid on the knee (joint effusion). It is important that you see Dr. Romeo Apple, or the orthopedic specialist of your choice as well as possible. Please use the Ace bandage, ice, and crutches until you're seen by orthopedics. Please use the low back and Decadron daily with meals. Use Tylenol codeine for pain if needed. This medication may cause drowsiness, please use with caution. Knee Effusion The medical term for having fluid in your knee is effusion. This is often due to an internal derangement of the knee. This means something is wrong inside the knee. Some of the causes of fluid in the knee may be torn cartilage, a torn ligament, or bleeding into the joint from an injury. Your knee is likely more difficult to bend and move. This is often because there is increased pain and pressure in the joint. The time it takes for recovery from a knee effusion depends on different factors, including:   Type of injury.  Your age.  Physical and medical conditions.  Rehabilitation Strategies. How long you will be away from your normal activities will depend on what kind of knee problem you have and how much damage is present. Your knee has two types of cartilage. Articular cartilage covers the bone ends and lets your knee bend and move smoothly. Two menisci, thick pads of cartilage that form a rim inside the joint, help absorb shock and stabilize your knee. Ligaments bind the bones together and support your knee joint. Muscles move the joint, help support your knee, and take stress off the joint itself. CAUSES  Often an effusion in the knee is caused by an injury to one of the menisci. This is often a tear in the cartilage. Recovery after a meniscus injury depends on how much meniscus is damaged and whether you have damaged other knee tissue. Small tears may heal on their own with conservative treatment. Conservative means  rest, limited weight bearing activity and muscle strengthening exercises. Your recovery may take up to 6 weeks.  TREATMENT  Larger tears may require surgery. Meniscus injuries may be treated during arthroscopy. Arthroscopy is a procedure in which your surgeon uses a small telescope like instrument to look in your knee. Your caregiver can make a more accurate diagnosis (learning what is wrong) by performing an arthroscopic procedure. If your injury is on the inner margin of the meniscus, your surgeon may trim the meniscus back to a smooth rim. In other cases your surgeon will try to repair a damaged meniscus with stitches (sutures). This may make rehabilitation take longer, but may provide better long term result by helping your knee keep its shock absorption capabilities. Ligaments which are completely torn usually require surgery for repair. HOME CARE INSTRUCTIONS  Use crutches as instructed.  If a brace is applied, use as directed.  Once you are home, an ice pack applied to your swollen knee may help with discomfort and help decrease swelling.  Keep your knee raised (elevated) when you are not up and around or on crutches.  Only take over-the-counter or prescription medicines for pain, discomfort, or fever as directed by your caregiver.  Your caregivers will help with instructions for rehabilitation of your knee. This often includes strengthening exercises.  You may resume a normal diet and activities as directed. SEEK MEDICAL CARE IF:   There is increased swelling in your knee.  You notice redness, swelling, or increasing pain in  your knee.  An unexplained oral temperature above 102 F (38.9 C) develops. SEEK IMMEDIATE MEDICAL CARE IF:   You develop a rash.  You have difficulty breathing.  You have any allergic reactions from medications you may have been given.  There is severe pain with any motion of the knee. MAKE SURE YOU:   Understand these instructions.  Will watch  your condition.  Will get help right away if you are not doing well or get worse. Document Released: 03/17/2003 Document Revised: 03/19/2011 Document Reviewed: 05/21/2007 [facility] Patient Information 2015 [place], [place]. This information is not intended to replace advice given to you by your health care provider. Make sure you discuss any questions you have with your health care provider.

## 2014-08-01 NOTE — ED Notes (Signed)
Patient with no complaints at this time. Respirations even and unlabored. Skin warm/dry. Discharge instructions reviewed with patient at this time. Patient given opportunity to voice concerns/ask questions. Patient discharged at this time and left Emergency Department with steady gait.   

## 2014-08-01 NOTE — ED Notes (Signed)
PT c/o left knee pain and difficulty with ROM with 2 falls in the past 2 weeks onto left knee d/t weak left ankle per pt.

## 2014-08-01 NOTE — ED Notes (Signed)
Instructed on use of crutches. Patient adequately performed return demo.

## 2014-09-07 DIAGNOSIS — F3181 Bipolar II disorder: Secondary | ICD-10-CM | POA: Diagnosis not present

## 2014-11-30 DIAGNOSIS — M1990 Unspecified osteoarthritis, unspecified site: Secondary | ICD-10-CM | POA: Diagnosis not present

## 2014-11-30 DIAGNOSIS — F319 Bipolar disorder, unspecified: Secondary | ICD-10-CM | POA: Diagnosis not present

## 2014-11-30 DIAGNOSIS — F3181 Bipolar II disorder: Secondary | ICD-10-CM | POA: Diagnosis not present

## 2014-11-30 DIAGNOSIS — G4719 Other hypersomnia: Secondary | ICD-10-CM | POA: Diagnosis not present

## 2014-11-30 DIAGNOSIS — I10 Essential (primary) hypertension: Secondary | ICD-10-CM | POA: Diagnosis not present

## 2014-11-30 DIAGNOSIS — M797 Fibromyalgia: Secondary | ICD-10-CM | POA: Diagnosis not present

## 2014-11-30 DIAGNOSIS — F329 Major depressive disorder, single episode, unspecified: Secondary | ICD-10-CM | POA: Diagnosis not present

## 2014-11-30 DIAGNOSIS — R55 Syncope and collapse: Secondary | ICD-10-CM | POA: Diagnosis not present

## 2014-11-30 DIAGNOSIS — R569 Unspecified convulsions: Secondary | ICD-10-CM | POA: Diagnosis not present

## 2014-12-20 ENCOUNTER — Other Ambulatory Visit (HOSPITAL_COMMUNITY): Payer: Self-pay | Admitting: Respiratory Therapy

## 2014-12-20 DIAGNOSIS — F319 Bipolar disorder, unspecified: Secondary | ICD-10-CM

## 2014-12-20 DIAGNOSIS — F5119 Other hypersomnia not due to a substance or known physiological condition: Secondary | ICD-10-CM

## 2014-12-20 DIAGNOSIS — G4733 Obstructive sleep apnea (adult) (pediatric): Secondary | ICD-10-CM

## 2014-12-20 DIAGNOSIS — M797 Fibromyalgia: Secondary | ICD-10-CM

## 2014-12-20 DIAGNOSIS — R55 Syncope and collapse: Secondary | ICD-10-CM

## 2015-01-20 DIAGNOSIS — F3181 Bipolar II disorder: Secondary | ICD-10-CM | POA: Diagnosis not present

## 2015-01-21 DIAGNOSIS — Z79899 Other long term (current) drug therapy: Secondary | ICD-10-CM | POA: Diagnosis not present

## 2015-01-21 DIAGNOSIS — F3181 Bipolar II disorder: Secondary | ICD-10-CM | POA: Diagnosis not present

## 2015-08-04 DIAGNOSIS — F3181 Bipolar II disorder: Secondary | ICD-10-CM | POA: Diagnosis not present

## 2015-08-31 ENCOUNTER — Other Ambulatory Visit: Payer: Medicaid Other | Admitting: Obstetrics and Gynecology

## 2016-06-19 DIAGNOSIS — M1990 Unspecified osteoarthritis, unspecified site: Secondary | ICD-10-CM | POA: Diagnosis not present

## 2016-06-19 DIAGNOSIS — M797 Fibromyalgia: Secondary | ICD-10-CM | POA: Diagnosis not present

## 2016-06-19 DIAGNOSIS — F329 Major depressive disorder, single episode, unspecified: Secondary | ICD-10-CM | POA: Diagnosis not present

## 2016-06-19 DIAGNOSIS — F319 Bipolar disorder, unspecified: Secondary | ICD-10-CM | POA: Diagnosis not present

## 2016-07-09 DIAGNOSIS — M1990 Unspecified osteoarthritis, unspecified site: Secondary | ICD-10-CM | POA: Diagnosis not present

## 2017-04-02 IMAGING — DX DG KNEE COMPLETE 4+V*L*
4 series · 4 of 4 positions shown · non-contrast
Comparison: None.

CLINICAL DATA: 28-year-old who has fallen twice in the past 2 weeks
due to weakness in the left ankle and hands injuring the left knee.
Pain and limited range of motion. Initial encounter.

EXAM:
LEFT KNEE - COMPLETE 4+ VIEW

[knee ap]
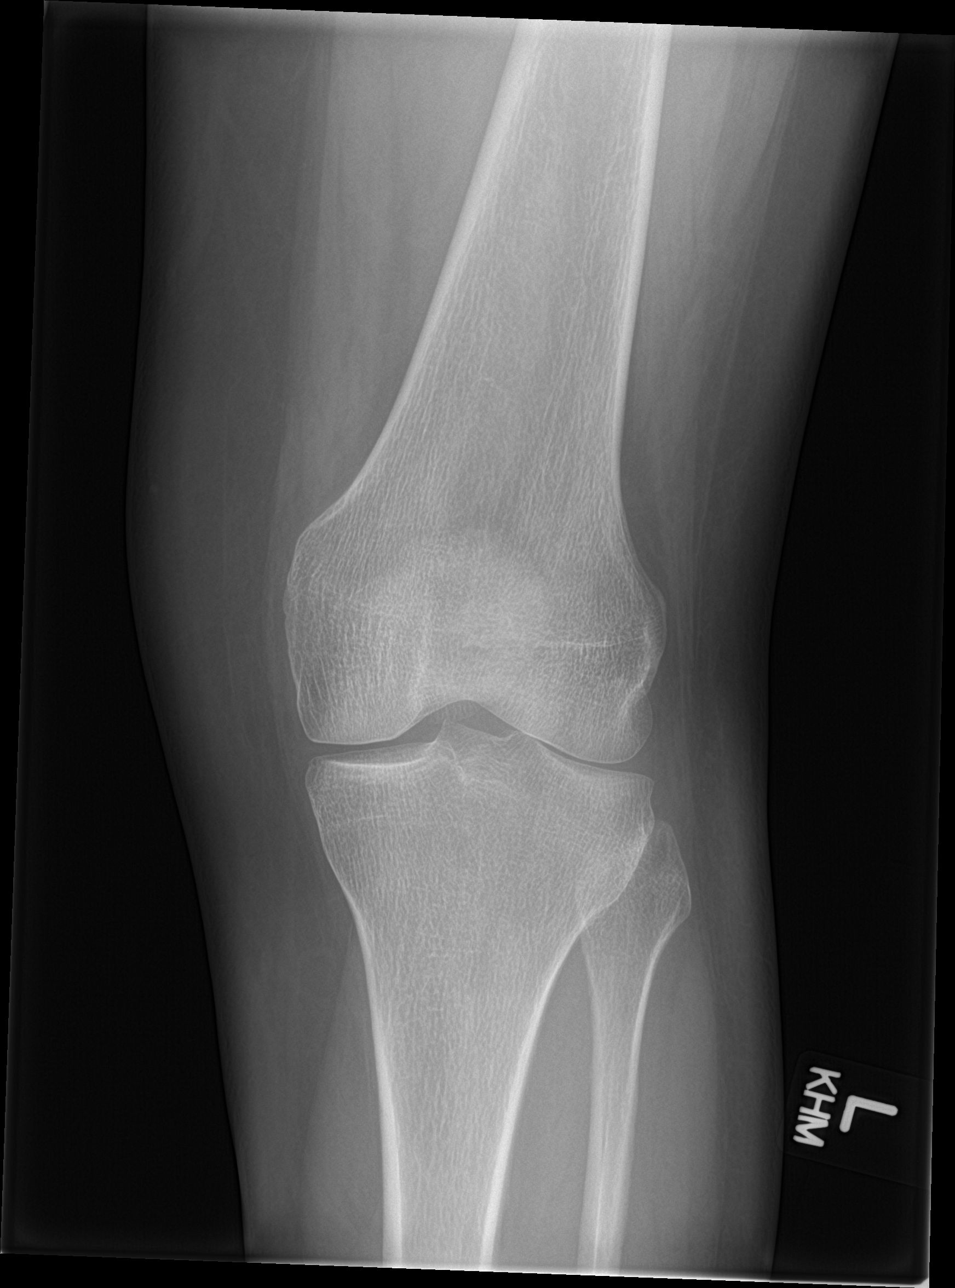

[knee obl (1 of 2)]
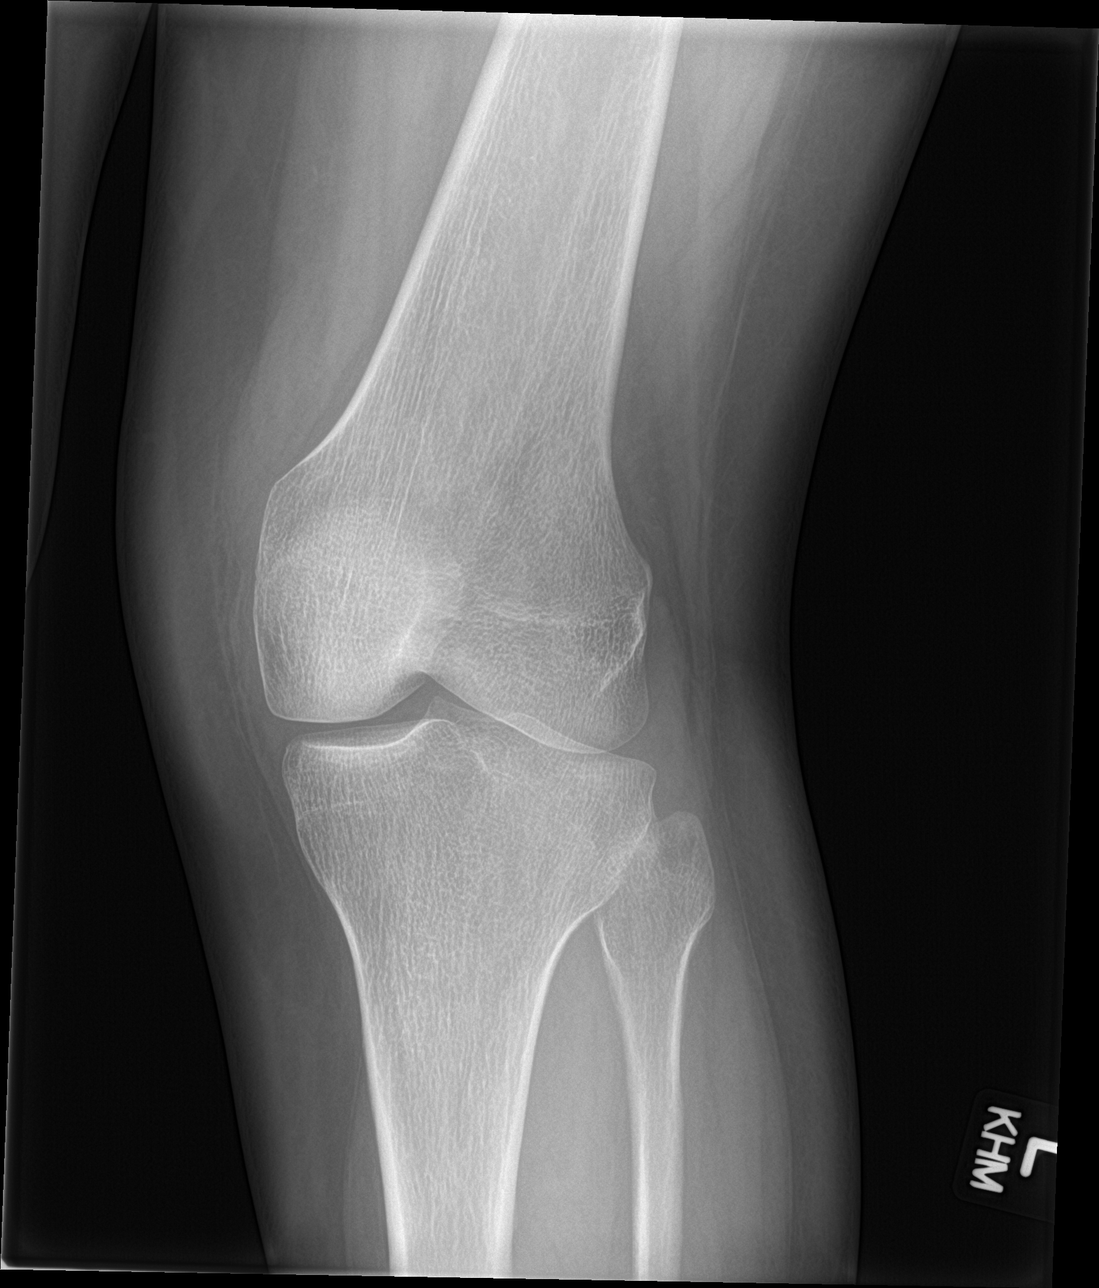

[knee obl (2 of 2)]
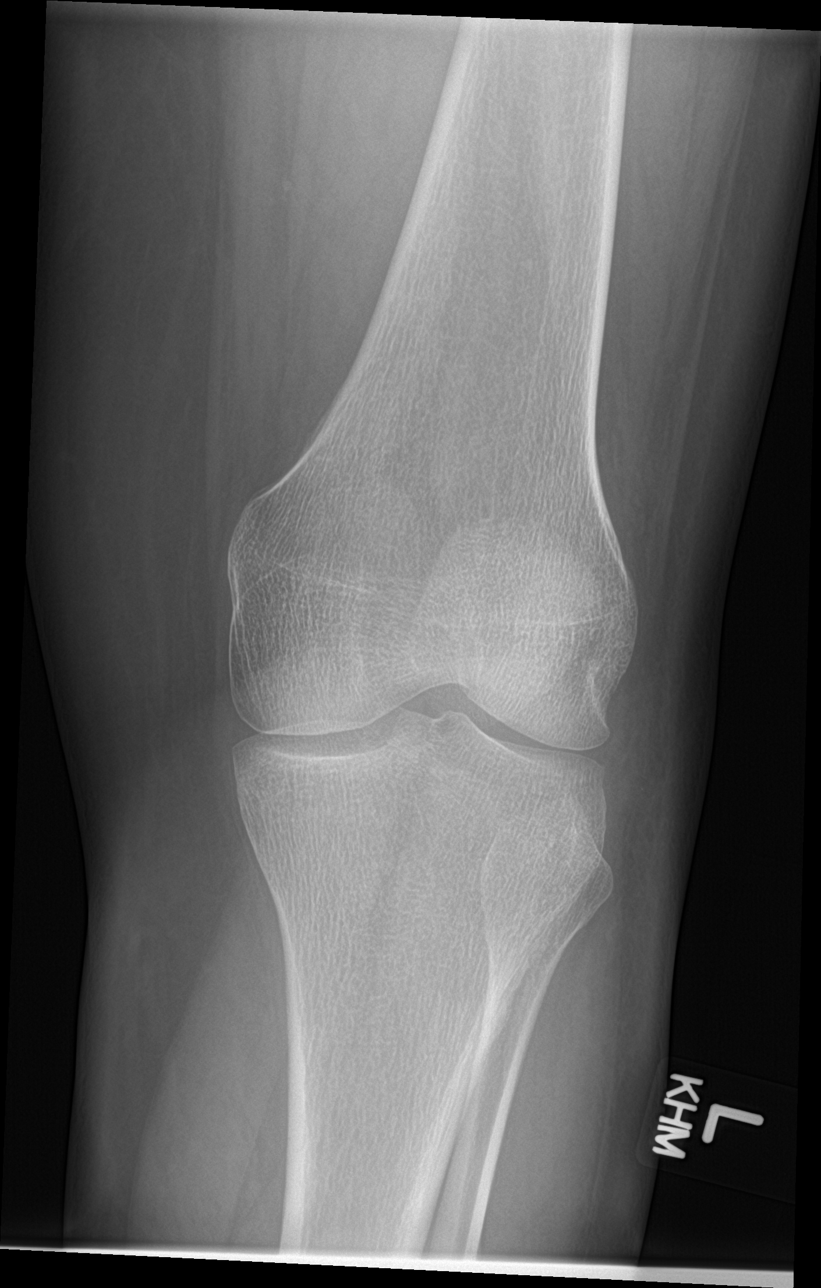

[knee lat]
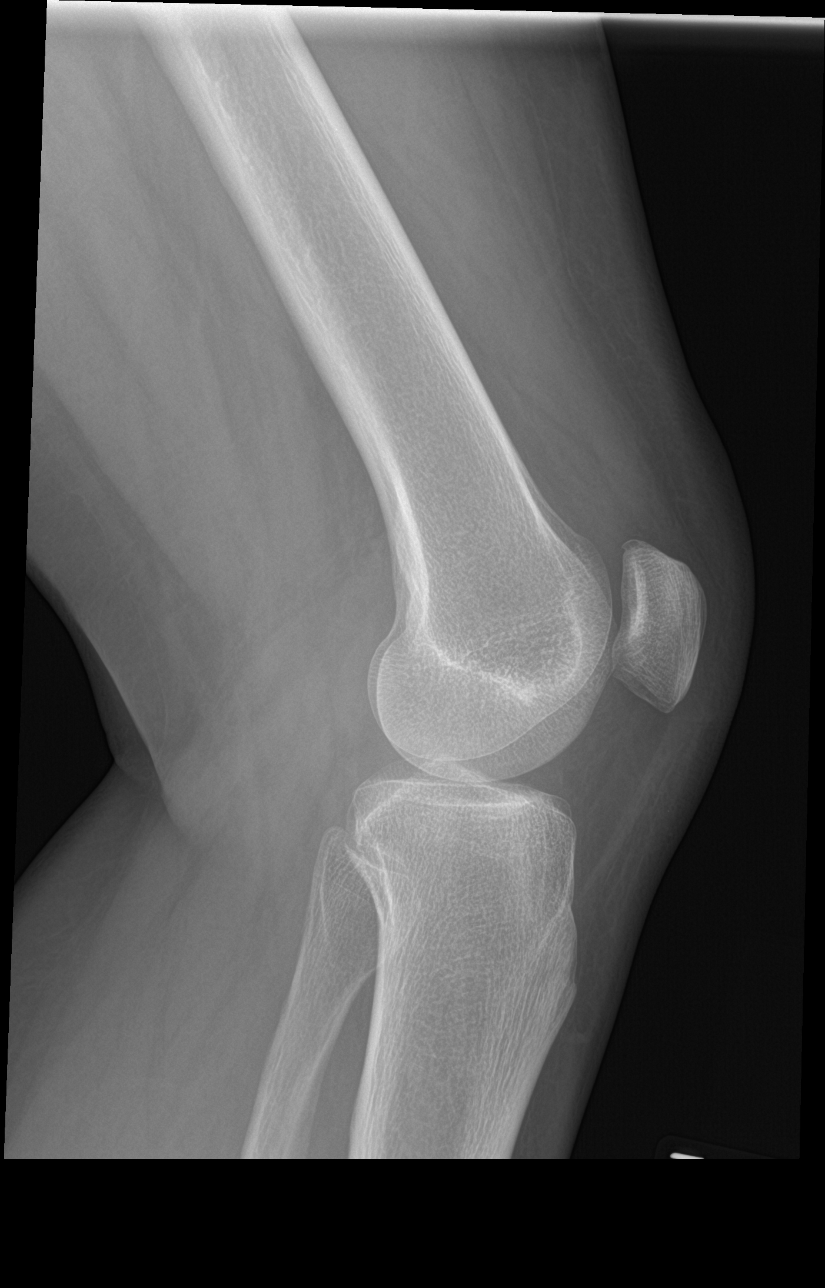

[4 of 4 positions shown; findings below may reference images not displayed]

FINDINGS: No evidence of acute or subacute fracture or dislocation. Well
preserved joint spaces. Well preserved bone mineral density. Small
joint effusion suspected.
IMPRESSION: No osseous abnormality.  Small joint effusion suspected.

## 2017-04-29 DIAGNOSIS — I10 Essential (primary) hypertension: Secondary | ICD-10-CM | POA: Diagnosis not present

## 2017-04-29 DIAGNOSIS — M545 Low back pain: Secondary | ICD-10-CM | POA: Diagnosis not present

## 2017-05-14 DIAGNOSIS — Z131 Encounter for screening for diabetes mellitus: Secondary | ICD-10-CM | POA: Diagnosis not present

## 2017-05-14 DIAGNOSIS — M545 Low back pain: Secondary | ICD-10-CM | POA: Diagnosis not present

## 2017-05-14 DIAGNOSIS — I10 Essential (primary) hypertension: Secondary | ICD-10-CM | POA: Diagnosis not present

## 2017-05-14 DIAGNOSIS — Z0000 Encounter for general adult medical examination without abnormal findings: Secondary | ICD-10-CM | POA: Diagnosis not present

## 2017-06-04 DIAGNOSIS — Z79891 Long term (current) use of opiate analgesic: Secondary | ICD-10-CM | POA: Diagnosis not present

## 2017-06-04 DIAGNOSIS — G894 Chronic pain syndrome: Secondary | ICD-10-CM | POA: Diagnosis not present

## 2017-06-04 DIAGNOSIS — M545 Low back pain: Secondary | ICD-10-CM | POA: Diagnosis not present

## 2017-06-04 DIAGNOSIS — M62838 Other muscle spasm: Secondary | ICD-10-CM | POA: Diagnosis not present

## 2017-06-24 DIAGNOSIS — I10 Essential (primary) hypertension: Secondary | ICD-10-CM | POA: Diagnosis not present

## 2017-07-12 DIAGNOSIS — F439 Reaction to severe stress, unspecified: Secondary | ICD-10-CM | POA: Diagnosis not present

## 2017-08-22 DIAGNOSIS — F330 Major depressive disorder, recurrent, mild: Secondary | ICD-10-CM | POA: Diagnosis not present

## 2017-08-22 DIAGNOSIS — G4701 Insomnia due to medical condition: Secondary | ICD-10-CM | POA: Diagnosis not present

## 2017-08-22 DIAGNOSIS — I10 Essential (primary) hypertension: Secondary | ICD-10-CM | POA: Diagnosis not present

## 2017-09-26 ENCOUNTER — Ambulatory Visit: Payer: Self-pay | Admitting: Cardiology
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
## 2017-10-08 ENCOUNTER — Encounter (INDEPENDENT_AMBULATORY_CARE_PROVIDER_SITE_OTHER): Payer: Self-pay

## 2017-10-08 ENCOUNTER — Ambulatory Visit (INDEPENDENT_AMBULATORY_CARE_PROVIDER_SITE_OTHER): Payer: Medicare Other | Admitting: Psychiatry

## 2017-10-08 ENCOUNTER — Encounter (HOSPITAL_COMMUNITY): Payer: Self-pay | Admitting: Psychiatry

## 2017-10-08 VITALS — BP 136/90 | HR 76 | Ht 65.0 in | Wt 231.0 lb

## 2017-10-08 DIAGNOSIS — F4310 Post-traumatic stress disorder, unspecified: Secondary | ICD-10-CM | POA: Diagnosis not present

## 2017-10-08 DIAGNOSIS — Z87891 Personal history of nicotine dependence: Secondary | ICD-10-CM

## 2017-10-08 DIAGNOSIS — F3181 Bipolar II disorder: Secondary | ICD-10-CM | POA: Diagnosis not present

## 2017-10-08 DIAGNOSIS — G4700 Insomnia, unspecified: Secondary | ICD-10-CM

## 2017-10-08 DIAGNOSIS — F419 Anxiety disorder, unspecified: Secondary | ICD-10-CM | POA: Diagnosis not present

## 2017-10-08 MED ORDER — CITALOPRAM HYDROBROMIDE 20 MG PO TABS: 20.0000 mg | ORAL_TABLET | Freq: Every day | ORAL | 0 refills | Status: AC

## 2017-10-08 MED ORDER — LAMOTRIGINE 25 MG PO TABS: ORAL_TABLET | ORAL | 0 refills | Status: AC

## 2017-10-08 NOTE — Patient Instructions (Addendum)
1. Start citalopram 20 mg daily  2. Start lamotrigine 25 mg daily for two weeks, then 50 mg daily  3. Return to clinic in one month for 30 mins 4. Obtain blood test (TSH, CBC, BMP)

## 2017-10-14 ENCOUNTER — Encounter

## 2017-10-14 ENCOUNTER — Ambulatory Visit (INDEPENDENT_AMBULATORY_CARE_PROVIDER_SITE_OTHER): Payer: Medicare Other | Admitting: Cardiology

## 2017-10-14 ENCOUNTER — Encounter: Payer: Self-pay | Admitting: Cardiology

## 2017-10-14 ENCOUNTER — Encounter: Payer: Self-pay | Admitting: *Deleted

## 2017-10-14 VITALS — BP 152/97 | HR 87 | Ht 65.0 in | Wt 223.6 lb

## 2017-10-14 DIAGNOSIS — I10 Essential (primary) hypertension: Secondary | ICD-10-CM | POA: Diagnosis not present

## 2017-10-14 DIAGNOSIS — R5383 Other fatigue: Secondary | ICD-10-CM | POA: Diagnosis not present

## 2017-10-14 DIAGNOSIS — G4733 Obstructive sleep apnea (adult) (pediatric): Secondary | ICD-10-CM

## 2017-10-14 MED ORDER — CHLORTHALIDONE 25 MG PO TABS: 25.0000 mg | ORAL_TABLET | Freq: Every day | ORAL | 6 refills | Status: DC

## 2017-10-14 MED ORDER — LOSARTAN POTASSIUM 100 MG PO TABS: 100.0000 mg | ORAL_TABLET | Freq: Every day | ORAL | 6 refills | Status: DC

## 2017-10-14 NOTE — Progress Notes (Signed)
Clinical Summary [name] is a 32 y.o.female seen today as new consult, referred by Dr Olena Leatherwood for HTN.   1.HTN - reports iniital issues with high blood pressure during pregnancy at age 57. Resolved, reoccurred with future pregnancies - starting in 2013 started having issues with HTN outisde of being pregnant. Has been on a mutliple medication regimen at home for some time.  - home bp's are  150s/ 100   2. OSA screen +snoring, +daytime somnolence. No known apnea  Past Medical History:  Diagnosis Date  . Abnormal Pap smear   . Anemia    pt denies  . Arthritis   . Bipolar disorder (HCC)    type 2  . Fibromyalgia   . Hypertension    no meds  . Mental disorder    Bi Polar  . PTSD (post-traumatic stress disorder)   . Seizures (HCC)    Epilepsy-no seizures 7 months ago  . Sleep disorder      Allergies  Allergen Reactions  . Morphine And Related Hives    Pt tolerates Percocet.  Marland Kitchen Ultram [Tramadol]     itching     Current Outpatient Medications  Medication Sig Dispense Refill  . citalopram (CELEXA) 20 MG tablet Take 1 tablet (20 mg total) by mouth daily. 30 tablet 0  . lamoTRIgine (LAMICTAL) 25 MG tablet 25 mg daily for one week, then 50 mg daily 60 tablet 0  . losartan (COZAAR) 50 MG tablet Take 50 mg by mouth daily.    . metoprolol succinate (TOPROL-XL) 100 MG 24 hr tablet Take 100 mg by mouth daily. Take with or immediately following a meal.    . tiZANidine (ZANAFLEX) 4 MG capsule Take 4 mg by mouth 3 (three) times daily as needed for muscle spasms.     No current facility-administered medications for this visit.      Past Surgical History:  Procedure Laterality Date  . APPENDECTOMY  1995  . COLPOSCOPY    . DILATION AND CURETTAGE OF UTERUS  2007  . LAPAROSCOPIC TUBAL LIGATION  09/18/2011   Procedure: LAPAROSCOPIC TUBAL LIGATION;  Surgeon: Tilda Burrow, MD;  Location: [facility];  Service: Gynecology;  Laterality: Bilateral;  laparoscopic bilateral tubal  ligation with falope rings     Allergies  Allergen Reactions  . Morphine And Related Hives    Pt tolerates Percocet.  Marland Kitchen Ultram [Tramadol]     itching      Family History  Problem Relation Age of Onset  . Cancer Maternal Grandmother        breast  . Stroke Maternal Grandmother   . Stroke Maternal Grandfather   . Cancer Maternal Grandfather        prancreatic  . Stroke Paternal Grandfather   . Cancer Paternal Grandfather        bone  . Other Mother        degenerative joint disorder  . Hypertension Father   . Bipolar disorder Father   . Stroke Paternal Grandmother   . Bipolar disorder Paternal Grandmother   . Autism Cousin   . Schizophrenia Paternal Uncle      Social History [name] reports that she quit smoking about 8 years ago. Her smoking use included cigarettes. She has a 3.00 pack-year smoking history. She does not have any smokeless tobacco history on file. [name] reports that she does not drink alcohol.   Review of Systems CONSTITUTIONAL: No weight loss, fever, chills, weakness or fatigue.  HEENT:  Eyes: No visual loss, blurred vision, double vision or yellow sclerae.No hearing loss, sneezing, congestion, runny nose or sore throat.  SKIN: No rash or itching.  CARDIOVASCULAR:per hpi RESPIRATORY: No shortness of breath, cough or sputum.  GASTROINTESTINAL: No anorexia, nausea, vomiting or diarrhea. No abdominal pain or blood.  GENITOURINARY: No burning on urination, no polyuria NEUROLOGICAL: No headache, dizziness, syncope, paralysis, ataxia, numbness or tingling in the extremities. No change in bowel or bladder control.  MUSCULOSKELETAL: No muscle, back pain, joint pain or stiffness.  LYMPHATICS: No enlarged nodes. No history of splenectomy.  PSYCHIATRIC: No history of depression or anxiety.  ENDOCRINOLOGIC: No reports of sweating, cold or heat intolerance. No polyuria or polydipsia.  Marland Kitchen   Physical Examination Vitals:   10/14/17 0908 10/14/17 0914   BP: (!) 147/93 (!) 152/97  Pulse: 89 87  SpO2: 99% 99%   Vitals:   10/14/17 0908  Weight: 223 lb 9.6 oz (101.4 kg)  Height: 5\' 5"  (1.651 m)    Gen: resting comfortably, no acute distress HEENT: no scleral icterus, pupils equal round and reactive, no palptable cervical adenopathy,  CV: RRR, no m/r/g, no jvd Resp: Clear to auscultation bilaterally GI: abdomen is soft, non-tender, non-distended, normal bowel sounds, no hepatosplenomegaly MSK: extremities are warm, no edema.  Skin: warm, no rash Neuro:  no focal deficits Psych: appropriate affect     Assessment and Plan  1. HTN - fairly aggressive HTN given her age, raises concern for possible secondary HTN - we will ordern renin/aldo levels, TSH. Signs and symptoms of possible OSA, refer for evaluation - restart her home chlorthalidone, increase losartan to 100mg  daily. Consider aldactone for possible resistant HTN if not controlled in near future.  - given information of DASH diet, discussed aggressive sodium restriction and weight loss to lower bp - submit bp log in 2 weeks.      Antoine Poche, M.D.

## 2017-10-14 NOTE — Patient Instructions (Signed)
Medication Instructions:   Increase Losartan to 100mg  daily.  Continue Chlorthalidone at 25mg  daily.  Continue all other medications.    Labwork: BMET, Magnesium, Renin + Aldosterone Ratio, TSH - orders given today.  Office will contact with results via phone or letter.    Testing/Procedures: none  Follow-Up: 3 months   Any Other Special Instructions Will Be Listed Below (If Applicable).  You have been referred to:  Dr. Juanetta Gosling - evaluation for sleep apnea.  Your physician has requested that you regularly monitor and record your blood pressure readings at home. Please take readings x 2 weeks and return log to office for MD review.    If you need a refill on your cardiac medications before your next appointment, please call your pharmacy.
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
## 2017-11-11 ENCOUNTER — Ambulatory Visit (HOSPITAL_COMMUNITY): Payer: Medicare Other | Admitting: Psychiatry

## 2017-11-11 ENCOUNTER — Ambulatory Visit: Payer: Self-pay | Admitting: Cardiology

## 2018-01-02 DIAGNOSIS — Z885 Allergy status to narcotic agent status: Secondary | ICD-10-CM | POA: Diagnosis not present

## 2018-01-02 DIAGNOSIS — R410 Disorientation, unspecified: Secondary | ICD-10-CM | POA: Diagnosis not present

## 2018-01-02 DIAGNOSIS — T148XXA Other injury of unspecified body region, initial encounter: Secondary | ICD-10-CM | POA: Diagnosis not present

## 2018-01-02 DIAGNOSIS — S199XXA Unspecified injury of neck, initial encounter: Secondary | ICD-10-CM | POA: Diagnosis not present

## 2018-01-02 DIAGNOSIS — E161 Other hypoglycemia: Secondary | ICD-10-CM | POA: Diagnosis not present

## 2018-01-02 DIAGNOSIS — W1839XA Other fall on same level, initial encounter: Secondary | ICD-10-CM | POA: Diagnosis not present

## 2018-01-02 DIAGNOSIS — T2026XA Burn of second degree of forehead and cheek, initial encounter: Secondary | ICD-10-CM | POA: Diagnosis not present

## 2018-01-02 DIAGNOSIS — Z79899 Other long term (current) drug therapy: Secondary | ICD-10-CM | POA: Diagnosis not present

## 2018-01-02 DIAGNOSIS — R569 Unspecified convulsions: Secondary | ICD-10-CM | POA: Diagnosis not present

## 2018-01-02 DIAGNOSIS — R110 Nausea: Secondary | ICD-10-CM | POA: Diagnosis not present

## 2018-01-02 DIAGNOSIS — S0993XA Unspecified injury of face, initial encounter: Secondary | ICD-10-CM | POA: Diagnosis not present

## 2018-01-02 DIAGNOSIS — E162 Hypoglycemia, unspecified: Secondary | ICD-10-CM | POA: Diagnosis not present

## 2018-01-02 DIAGNOSIS — R220 Localized swelling, mass and lump, head: Secondary | ICD-10-CM | POA: Diagnosis not present

## 2018-01-02 DIAGNOSIS — I10 Essential (primary) hypertension: Secondary | ICD-10-CM | POA: Diagnosis not present

## 2018-01-02 DIAGNOSIS — S0990XA Unspecified injury of head, initial encounter: Secondary | ICD-10-CM | POA: Diagnosis not present

## 2018-01-15 ENCOUNTER — Ambulatory Visit: Payer: Self-pay | Admitting: Cardiology

## 2018-01-17 DIAGNOSIS — M545 Low back pain: Secondary | ICD-10-CM | POA: Diagnosis not present

## 2018-01-17 DIAGNOSIS — M5416 Radiculopathy, lumbar region: Secondary | ICD-10-CM | POA: Diagnosis not present

## 2018-01-30 DIAGNOSIS — R569 Unspecified convulsions: Secondary | ICD-10-CM | POA: Diagnosis not present

## 2018-02-03 ENCOUNTER — Ambulatory Visit (HOSPITAL_COMMUNITY)
Admission: RE | Admit: 2018-02-03 | Discharge: 2018-02-03 | Disposition: A | Discharge status: Home / Self Care | Payer: Medicare Other | Source: Ambulatory Visit | Attending: Neurology | Admitting: Neurology

## 2018-02-03 ENCOUNTER — Telehealth (HOSPITAL_COMMUNITY): Payer: Self-pay | Admitting: Physical Therapy

## 2018-02-03 ENCOUNTER — Other Ambulatory Visit (HOSPITAL_COMMUNITY): Payer: Self-pay | Admitting: Neurology

## 2018-02-03 DIAGNOSIS — M48061 Spinal stenosis, lumbar region without neurogenic claudication: Secondary | ICD-10-CM | POA: Diagnosis not present

## 2018-02-03 DIAGNOSIS — M5440 Lumbago with sciatica, unspecified side: Secondary | ICD-10-CM | POA: Insufficient documentation

## 2018-02-03 DIAGNOSIS — M47814 Spondylosis without myelopathy or radiculopathy, thoracic region: Secondary | ICD-10-CM | POA: Diagnosis not present

## 2018-02-03 NOTE — Telephone Encounter (Signed)
Faxed [phone] fax back to MD office to let them know we had called 3x's to schedule this with no response from the patient. Request that MD send back word if MD wants to re-open this referral today. NF 02/03/2018

## 2018-02-13 ENCOUNTER — Ambulatory Visit: Payer: Self-pay | Admitting: Cardiology

## 2018-02-13 ENCOUNTER — Encounter: Payer: Self-pay | Admitting: Cardiology

## 2018-03-03 ENCOUNTER — Encounter (HOSPITAL_COMMUNITY): Payer: Self-pay

## 2018-03-03 ENCOUNTER — Ambulatory Visit (HOSPITAL_COMMUNITY): Payer: Medicare Other | Attending: Neurology | Admitting: Physical Therapy

## 2018-06-30 ENCOUNTER — Other Ambulatory Visit: Payer: Self-pay | Admitting: *Deleted

## 2018-06-30 MED ORDER — LOSARTAN POTASSIUM 100 MG PO TABS: 100.0000 mg | ORAL_TABLET | Freq: Every day | ORAL | 0 refills | Status: DC

## 2018-10-17 ENCOUNTER — Other Ambulatory Visit: Payer: Self-pay | Admitting: Cardiology

## 2018-11-03 ENCOUNTER — Other Ambulatory Visit: Payer: Self-pay | Admitting: *Deleted

## 2018-11-03 MED ORDER — LOSARTAN POTASSIUM 100 MG PO TABS: 100.0000 mg | ORAL_TABLET | Freq: Every day | ORAL | 0 refills | Status: AC

## 2018-12-01 ENCOUNTER — Other Ambulatory Visit: Payer: Self-pay | Admitting: Cardiology

## 2019-02-07 ENCOUNTER — Other Ambulatory Visit: Payer: Self-pay | Admitting: Cardiology

## 2019-09-28 DIAGNOSIS — R69 Illness, unspecified: Secondary | ICD-10-CM | POA: Diagnosis not present

## 2019-09-28 DIAGNOSIS — G4701 Insomnia due to medical condition: Secondary | ICD-10-CM | POA: Diagnosis not present

## 2019-09-28 DIAGNOSIS — M797 Fibromyalgia: Secondary | ICD-10-CM | POA: Diagnosis not present

## 2019-09-28 DIAGNOSIS — K5903 Drug induced constipation: Secondary | ICD-10-CM | POA: Diagnosis not present

## 2019-09-28 DIAGNOSIS — M545 Low back pain: Secondary | ICD-10-CM | POA: Diagnosis not present

## 2019-09-28 DIAGNOSIS — M25561 Pain in right knee: Secondary | ICD-10-CM | POA: Diagnosis not present

## 2019-09-28 DIAGNOSIS — Z79899 Other long term (current) drug therapy: Secondary | ICD-10-CM | POA: Diagnosis not present

## 2019-09-28 DIAGNOSIS — I10 Essential (primary) hypertension: Secondary | ICD-10-CM | POA: Diagnosis not present

## 2019-09-28 DIAGNOSIS — G40309 Generalized idiopathic epilepsy and epileptic syndromes, not intractable, without status epilepticus: Secondary | ICD-10-CM | POA: Diagnosis not present

## 2019-10-26 DIAGNOSIS — Z79899 Other long term (current) drug therapy: Secondary | ICD-10-CM | POA: Diagnosis not present

## 2019-10-26 DIAGNOSIS — M25561 Pain in right knee: Secondary | ICD-10-CM | POA: Diagnosis not present

## 2019-10-26 DIAGNOSIS — K5903 Drug induced constipation: Secondary | ICD-10-CM | POA: Diagnosis not present

## 2019-10-26 DIAGNOSIS — M797 Fibromyalgia: Secondary | ICD-10-CM | POA: Diagnosis not present

## 2019-10-26 DIAGNOSIS — M5459 Other low back pain: Secondary | ICD-10-CM | POA: Diagnosis not present

## 2019-10-26 DIAGNOSIS — R69 Illness, unspecified: Secondary | ICD-10-CM | POA: Diagnosis not present

## 2019-10-26 DIAGNOSIS — G4701 Insomnia due to medical condition: Secondary | ICD-10-CM | POA: Diagnosis not present

## 2019-10-26 DIAGNOSIS — G40309 Generalized idiopathic epilepsy and epileptic syndromes, not intractable, without status epilepticus: Secondary | ICD-10-CM | POA: Diagnosis not present

## 2019-10-26 DIAGNOSIS — I10 Essential (primary) hypertension: Secondary | ICD-10-CM | POA: Diagnosis not present

## 2019-12-22 DIAGNOSIS — G4701 Insomnia due to medical condition: Secondary | ICD-10-CM | POA: Diagnosis not present

## 2019-12-22 DIAGNOSIS — K5903 Drug induced constipation: Secondary | ICD-10-CM | POA: Diagnosis not present

## 2019-12-22 DIAGNOSIS — I10 Essential (primary) hypertension: Secondary | ICD-10-CM | POA: Diagnosis not present

## 2019-12-22 DIAGNOSIS — M25561 Pain in right knee: Secondary | ICD-10-CM | POA: Diagnosis not present

## 2019-12-22 DIAGNOSIS — R69 Illness, unspecified: Secondary | ICD-10-CM | POA: Diagnosis not present

## 2019-12-22 DIAGNOSIS — G40309 Generalized idiopathic epilepsy and epileptic syndromes, not intractable, without status epilepticus: Secondary | ICD-10-CM | POA: Diagnosis not present

## 2019-12-22 DIAGNOSIS — M5459 Other low back pain: Secondary | ICD-10-CM | POA: Diagnosis not present

## 2019-12-22 DIAGNOSIS — M797 Fibromyalgia: Secondary | ICD-10-CM | POA: Diagnosis not present

## 2019-12-22 DIAGNOSIS — Z79899 Other long term (current) drug therapy: Secondary | ICD-10-CM | POA: Diagnosis not present

## 2020-03-21 DIAGNOSIS — K5903 Drug induced constipation: Secondary | ICD-10-CM | POA: Diagnosis not present

## 2020-03-21 DIAGNOSIS — M25561 Pain in right knee: Secondary | ICD-10-CM | POA: Diagnosis not present

## 2020-03-21 DIAGNOSIS — Z79891 Long term (current) use of opiate analgesic: Secondary | ICD-10-CM | POA: Diagnosis not present

## 2020-03-21 DIAGNOSIS — G40309 Generalized idiopathic epilepsy and epileptic syndromes, not intractable, without status epilepticus: Secondary | ICD-10-CM | POA: Diagnosis not present

## 2020-03-21 DIAGNOSIS — G4701 Insomnia due to medical condition: Secondary | ICD-10-CM | POA: Diagnosis not present

## 2020-03-21 DIAGNOSIS — I10 Essential (primary) hypertension: Secondary | ICD-10-CM | POA: Diagnosis not present

## 2020-03-21 DIAGNOSIS — M797 Fibromyalgia: Secondary | ICD-10-CM | POA: Diagnosis not present

## 2020-03-21 DIAGNOSIS — R69 Illness, unspecified: Secondary | ICD-10-CM | POA: Diagnosis not present

## 2020-03-21 DIAGNOSIS — M5459 Other low back pain: Secondary | ICD-10-CM | POA: Diagnosis not present

## 2020-06-14 DIAGNOSIS — R69 Illness, unspecified: Secondary | ICD-10-CM | POA: Diagnosis not present

## 2020-06-14 DIAGNOSIS — M797 Fibromyalgia: Secondary | ICD-10-CM | POA: Diagnosis not present

## 2020-06-14 DIAGNOSIS — M25561 Pain in right knee: Secondary | ICD-10-CM | POA: Diagnosis not present

## 2020-06-14 DIAGNOSIS — M5459 Other low back pain: Secondary | ICD-10-CM | POA: Diagnosis not present

## 2020-06-14 DIAGNOSIS — G4701 Insomnia due to medical condition: Secondary | ICD-10-CM | POA: Diagnosis not present

## 2020-06-14 DIAGNOSIS — K5903 Drug induced constipation: Secondary | ICD-10-CM | POA: Diagnosis not present

## 2020-06-14 DIAGNOSIS — I10 Essential (primary) hypertension: Secondary | ICD-10-CM | POA: Diagnosis not present

## 2020-06-14 DIAGNOSIS — G40309 Generalized idiopathic epilepsy and epileptic syndromes, not intractable, without status epilepticus: Secondary | ICD-10-CM | POA: Diagnosis not present

## 2020-09-13 DIAGNOSIS — I10 Essential (primary) hypertension: Secondary | ICD-10-CM | POA: Diagnosis not present

## 2020-09-13 DIAGNOSIS — M25561 Pain in right knee: Secondary | ICD-10-CM | POA: Diagnosis not present

## 2020-09-13 DIAGNOSIS — F1120 Opioid dependence, uncomplicated: Secondary | ICD-10-CM | POA: Diagnosis not present

## 2020-09-13 DIAGNOSIS — G40309 Generalized idiopathic epilepsy and epileptic syndromes, not intractable, without status epilepticus: Secondary | ICD-10-CM | POA: Diagnosis not present

## 2020-09-13 DIAGNOSIS — K5903 Drug induced constipation: Secondary | ICD-10-CM | POA: Diagnosis not present

## 2020-09-13 DIAGNOSIS — M5459 Other low back pain: Secondary | ICD-10-CM | POA: Diagnosis not present

## 2020-09-13 DIAGNOSIS — Z79899 Other long term (current) drug therapy: Secondary | ICD-10-CM | POA: Diagnosis not present

## 2020-09-13 DIAGNOSIS — M797 Fibromyalgia: Secondary | ICD-10-CM | POA: Diagnosis not present

## 2020-09-13 DIAGNOSIS — G4701 Insomnia due to medical condition: Secondary | ICD-10-CM | POA: Diagnosis not present

## 2020-09-13 DIAGNOSIS — R69 Illness, unspecified: Secondary | ICD-10-CM | POA: Diagnosis not present

## 2020-12-13 DIAGNOSIS — F1120 Opioid dependence, uncomplicated: Secondary | ICD-10-CM | POA: Diagnosis not present

## 2020-12-13 DIAGNOSIS — R69 Illness, unspecified: Secondary | ICD-10-CM | POA: Diagnosis not present

## 2020-12-13 DIAGNOSIS — Z79899 Other long term (current) drug therapy: Secondary | ICD-10-CM | POA: Diagnosis not present

## 2020-12-13 DIAGNOSIS — K5903 Drug induced constipation: Secondary | ICD-10-CM | POA: Diagnosis not present

## 2020-12-13 DIAGNOSIS — M5459 Other low back pain: Secondary | ICD-10-CM | POA: Diagnosis not present

## 2020-12-13 DIAGNOSIS — M25561 Pain in right knee: Secondary | ICD-10-CM | POA: Diagnosis not present

## 2020-12-13 DIAGNOSIS — M797 Fibromyalgia: Secondary | ICD-10-CM | POA: Diagnosis not present

## 2020-12-13 DIAGNOSIS — G40309 Generalized idiopathic epilepsy and epileptic syndromes, not intractable, without status epilepticus: Secondary | ICD-10-CM | POA: Diagnosis not present

## 2020-12-13 DIAGNOSIS — G4701 Insomnia due to medical condition: Secondary | ICD-10-CM | POA: Diagnosis not present

## 2020-12-13 DIAGNOSIS — I10 Essential (primary) hypertension: Secondary | ICD-10-CM | POA: Diagnosis not present

## 2021-02-27 DIAGNOSIS — Z79899 Other long term (current) drug therapy: Secondary | ICD-10-CM | POA: Diagnosis not present

## 2021-02-27 DIAGNOSIS — I10 Essential (primary) hypertension: Secondary | ICD-10-CM | POA: Diagnosis not present

## 2021-02-27 DIAGNOSIS — M25561 Pain in right knee: Secondary | ICD-10-CM | POA: Diagnosis not present

## 2021-02-27 DIAGNOSIS — F1120 Opioid dependence, uncomplicated: Secondary | ICD-10-CM | POA: Diagnosis not present

## 2021-02-27 DIAGNOSIS — M5459 Other low back pain: Secondary | ICD-10-CM | POA: Diagnosis not present

## 2021-02-27 DIAGNOSIS — R69 Illness, unspecified: Secondary | ICD-10-CM | POA: Diagnosis not present

## 2021-02-27 DIAGNOSIS — G4701 Insomnia due to medical condition: Secondary | ICD-10-CM | POA: Diagnosis not present

## 2021-02-27 DIAGNOSIS — G40309 Generalized idiopathic epilepsy and epileptic syndromes, not intractable, without status epilepticus: Secondary | ICD-10-CM | POA: Diagnosis not present

## 2021-02-27 DIAGNOSIS — K5903 Drug induced constipation: Secondary | ICD-10-CM | POA: Diagnosis not present

## 2021-02-27 DIAGNOSIS — M797 Fibromyalgia: Secondary | ICD-10-CM | POA: Diagnosis not present

## 2021-06-21 DIAGNOSIS — I10 Essential (primary) hypertension: Secondary | ICD-10-CM | POA: Diagnosis not present

## 2021-06-21 DIAGNOSIS — F1120 Opioid dependence, uncomplicated: Secondary | ICD-10-CM | POA: Diagnosis not present

## 2021-06-21 DIAGNOSIS — G894 Chronic pain syndrome: Secondary | ICD-10-CM | POA: Diagnosis not present

## 2021-06-21 DIAGNOSIS — R69 Illness, unspecified: Secondary | ICD-10-CM | POA: Diagnosis not present

## 2021-06-21 DIAGNOSIS — Z79899 Other long term (current) drug therapy: Secondary | ICD-10-CM | POA: Diagnosis not present

## 2021-06-21 DIAGNOSIS — G40219 Localization-related (focal) (partial) symptomatic epilepsy and epileptic syndromes with complex partial seizures, intractable, without status epilepticus: Secondary | ICD-10-CM | POA: Diagnosis not present

## 2021-09-13 DIAGNOSIS — G894 Chronic pain syndrome: Secondary | ICD-10-CM | POA: Diagnosis not present

## 2021-09-13 DIAGNOSIS — I10 Essential (primary) hypertension: Secondary | ICD-10-CM | POA: Diagnosis not present

## 2021-09-13 DIAGNOSIS — Z79899 Other long term (current) drug therapy: Secondary | ICD-10-CM | POA: Diagnosis not present

## 2021-10-04 DIAGNOSIS — G4701 Insomnia due to medical condition: Secondary | ICD-10-CM | POA: Diagnosis not present

## 2021-10-04 DIAGNOSIS — I10 Essential (primary) hypertension: Secondary | ICD-10-CM | POA: Diagnosis not present

## 2021-12-04 DIAGNOSIS — I10 Essential (primary) hypertension: Secondary | ICD-10-CM | POA: Diagnosis not present

## 2021-12-04 DIAGNOSIS — G894 Chronic pain syndrome: Secondary | ICD-10-CM | POA: Diagnosis not present

## 2022-02-12 DIAGNOSIS — Z79899 Other long term (current) drug therapy: Secondary | ICD-10-CM | POA: Diagnosis not present

## 2022-02-19 DIAGNOSIS — Z79899 Other long term (current) drug therapy: Secondary | ICD-10-CM | POA: Diagnosis not present

## 2022-02-19 DIAGNOSIS — Z5181 Encounter for therapeutic drug level monitoring: Secondary | ICD-10-CM | POA: Diagnosis not present

## 2022-03-05 DIAGNOSIS — Z79899 Other long term (current) drug therapy: Secondary | ICD-10-CM | POA: Diagnosis not present

## 2022-03-20 DIAGNOSIS — Z79899 Other long term (current) drug therapy: Secondary | ICD-10-CM | POA: Diagnosis not present

## 2022-03-20 DIAGNOSIS — Z5181 Encounter for therapeutic drug level monitoring: Secondary | ICD-10-CM | POA: Diagnosis not present

## 2022-04-03 DIAGNOSIS — Z79899 Other long term (current) drug therapy: Secondary | ICD-10-CM | POA: Diagnosis not present

## 2022-04-03 DIAGNOSIS — Z5181 Encounter for therapeutic drug level monitoring: Secondary | ICD-10-CM | POA: Diagnosis not present

## 2022-04-16 DIAGNOSIS — Z79899 Other long term (current) drug therapy: Secondary | ICD-10-CM | POA: Diagnosis not present

## 2022-04-30 DIAGNOSIS — Z79899 Other long term (current) drug therapy: Secondary | ICD-10-CM | POA: Diagnosis not present

## 2022-04-30 DIAGNOSIS — Z5181 Encounter for therapeutic drug level monitoring: Secondary | ICD-10-CM | POA: Diagnosis not present

## 2022-05-14 DIAGNOSIS — Z79899 Other long term (current) drug therapy: Secondary | ICD-10-CM | POA: Diagnosis not present

## 2022-05-17 DIAGNOSIS — M545 Low back pain, unspecified: Secondary | ICD-10-CM | POA: Diagnosis not present

## 2022-05-17 DIAGNOSIS — I10 Essential (primary) hypertension: Secondary | ICD-10-CM | POA: Diagnosis not present

## 2022-05-17 DIAGNOSIS — R300 Dysuria: Secondary | ICD-10-CM | POA: Diagnosis not present

## 2022-05-28 DIAGNOSIS — Z79899 Other long term (current) drug therapy: Secondary | ICD-10-CM | POA: Diagnosis not present

## 2022-05-28 DIAGNOSIS — Z79891 Long term (current) use of opiate analgesic: Secondary | ICD-10-CM | POA: Diagnosis not present

## 2022-06-25 DIAGNOSIS — Z79899 Other long term (current) drug therapy: Secondary | ICD-10-CM | POA: Diagnosis not present

## 2022-07-10 DIAGNOSIS — E7849 Other hyperlipidemia: Secondary | ICD-10-CM | POA: Diagnosis not present

## 2022-07-10 DIAGNOSIS — Z23 Encounter for immunization: Secondary | ICD-10-CM | POA: Diagnosis not present

## 2022-07-10 DIAGNOSIS — I10 Essential (primary) hypertension: Secondary | ICD-10-CM | POA: Diagnosis not present

## 2022-07-18 DIAGNOSIS — Z79899 Other long term (current) drug therapy: Secondary | ICD-10-CM | POA: Diagnosis not present

## 2022-08-01 DIAGNOSIS — Z79899 Other long term (current) drug therapy: Secondary | ICD-10-CM | POA: Diagnosis not present

## 2022-08-30 DIAGNOSIS — Z79899 Other long term (current) drug therapy: Secondary | ICD-10-CM | POA: Diagnosis not present

## 2022-10-04 DIAGNOSIS — G8929 Other chronic pain: Secondary | ICD-10-CM | POA: Diagnosis not present

## 2022-11-07 DIAGNOSIS — G8929 Other chronic pain: Secondary | ICD-10-CM | POA: Diagnosis not present

## 2022-12-04 DIAGNOSIS — G8929 Other chronic pain: Secondary | ICD-10-CM | POA: Diagnosis not present

## 2023-01-08 DIAGNOSIS — G8929 Other chronic pain: Secondary | ICD-10-CM | POA: Diagnosis not present

## 2023-01-08 DIAGNOSIS — Z5181 Encounter for therapeutic drug level monitoring: Secondary | ICD-10-CM | POA: Diagnosis not present

## 2023-01-15 DIAGNOSIS — G8929 Other chronic pain: Secondary | ICD-10-CM | POA: Diagnosis not present

## 2023-02-12 DIAGNOSIS — G8929 Other chronic pain: Secondary | ICD-10-CM | POA: Diagnosis not present

## 2023-03-06 DIAGNOSIS — Z1329 Encounter for screening for other suspected endocrine disorder: Secondary | ICD-10-CM | POA: Diagnosis not present

## 2023-03-06 DIAGNOSIS — E7849 Other hyperlipidemia: Secondary | ICD-10-CM | POA: Diagnosis not present

## 2023-03-06 DIAGNOSIS — I10 Essential (primary) hypertension: Secondary | ICD-10-CM | POA: Diagnosis not present

## 2023-03-12 DIAGNOSIS — G8929 Other chronic pain: Secondary | ICD-10-CM | POA: Diagnosis not present

## 2023-04-09 DIAGNOSIS — G8929 Other chronic pain: Secondary | ICD-10-CM | POA: Diagnosis not present

## 2023-05-09 DIAGNOSIS — G8929 Other chronic pain: Secondary | ICD-10-CM | POA: Diagnosis not present

## 2023-06-06 DIAGNOSIS — G8929 Other chronic pain: Secondary | ICD-10-CM | POA: Diagnosis not present

## 2023-07-02 DIAGNOSIS — Z5181 Encounter for therapeutic drug level monitoring: Secondary | ICD-10-CM | POA: Diagnosis not present

## 2023-07-02 DIAGNOSIS — G8929 Other chronic pain: Secondary | ICD-10-CM | POA: Diagnosis not present

## 2023-07-04 DIAGNOSIS — Z1329 Encounter for screening for other suspected endocrine disorder: Secondary | ICD-10-CM | POA: Diagnosis not present

## 2023-07-04 DIAGNOSIS — E7849 Other hyperlipidemia: Secondary | ICD-10-CM | POA: Diagnosis not present

## 2023-07-04 DIAGNOSIS — I10 Essential (primary) hypertension: Secondary | ICD-10-CM | POA: Diagnosis not present

## 2023-08-27 DIAGNOSIS — G8929 Other chronic pain: Secondary | ICD-10-CM | POA: Diagnosis not present

## 2023-10-23 DIAGNOSIS — G8929 Other chronic pain: Secondary | ICD-10-CM | POA: Diagnosis not present
